# Patient Record
Sex: Male | Born: 1975 | Race: White | Hispanic: No | Marital: Single | Smoking: Current every day smoker
Health system: Southern US, Community
[De-identification: ages and names within clinical notes are randomized; demographics above are authoritative.]

## PROBLEM LIST (undated history)

## (undated) DIAGNOSIS — F329 Major depressive disorder, single episode, unspecified: Secondary | ICD-10-CM

## (undated) DIAGNOSIS — F259 Schizoaffective disorder, unspecified: Secondary | ICD-10-CM

## (undated) DIAGNOSIS — F32A Depression, unspecified: Secondary | ICD-10-CM

## (undated) DIAGNOSIS — F319 Bipolar disorder, unspecified: Secondary | ICD-10-CM

## (undated) DIAGNOSIS — F250 Schizoaffective disorder, bipolar type: Secondary | ICD-10-CM

## (undated) DIAGNOSIS — K219 Gastro-esophageal reflux disease without esophagitis: Secondary | ICD-10-CM

## (undated) DIAGNOSIS — F429 Obsessive-compulsive disorder, unspecified: Secondary | ICD-10-CM

## (undated) HISTORY — PX: MOUTH SURGERY: SHX715

---

## 2012-06-24 ENCOUNTER — Emergency Department (HOSPITAL_COMMUNITY): Payer: Medicare Other

## 2012-06-24 ENCOUNTER — Emergency Department (HOSPITAL_COMMUNITY)
Admission: EM | Admit: 2012-06-24 | Discharge: 2012-06-24 | Disposition: A | Discharge status: Home / Self Care | Payer: Medicare Other | Attending: Emergency Medicine | Admitting: Emergency Medicine

## 2012-06-24 ENCOUNTER — Encounter (HOSPITAL_COMMUNITY): Payer: Self-pay | Admitting: Nurse Practitioner

## 2012-06-24 DIAGNOSIS — F17200 Nicotine dependence, unspecified, uncomplicated: Secondary | ICD-10-CM | POA: Insufficient documentation

## 2012-06-24 DIAGNOSIS — S02609A Fracture of mandible, unspecified, initial encounter for closed fracture: Secondary | ICD-10-CM | POA: Insufficient documentation

## 2012-06-24 MED ORDER — HYDROCODONE-ACETAMINOPHEN 7.5-325 MG/15ML PO SOLN
10.0000 mL | Freq: Once | ORAL | Status: AC
  Administered 2012-06-24: 10 mL via ORAL
  Filled 2012-06-24: qty 15

## 2012-06-24 MED ORDER — HYDROCODONE-ACETAMINOPHEN 7.5-500 MG/15ML PO SOLN: 15.0000 mL | Freq: Four times a day (QID) | ORAL | Status: DC | PRN
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
## 2012-06-26 NOTE — ED Provider Notes (Signed)
Medical screening examination/treatment/procedure(s) were performed by non-physician practitioner and as supervising physician I was immediately available for consultation/collaboration.  Welden Hausmann, MD 06/26/12 0059 

## 2013-01-05 ENCOUNTER — Emergency Department (HOSPITAL_COMMUNITY)
Admission: EM | Admit: 2013-01-05 | Discharge: 2013-01-05 | Disposition: A | Discharge status: Home / Self Care | Payer: Medicare Other | Attending: Emergency Medicine | Admitting: Emergency Medicine

## 2013-01-05 ENCOUNTER — Encounter (HOSPITAL_COMMUNITY): Payer: Self-pay | Admitting: Emergency Medicine

## 2013-01-05 ENCOUNTER — Emergency Department (HOSPITAL_COMMUNITY): Payer: Medicare Other

## 2013-01-05 DIAGNOSIS — S62309A Unspecified fracture of unspecified metacarpal bone, initial encounter for closed fracture: Secondary | ICD-10-CM | POA: Insufficient documentation

## 2013-01-05 DIAGNOSIS — S59909A Unspecified injury of unspecified elbow, initial encounter: Secondary | ICD-10-CM | POA: Insufficient documentation

## 2013-01-05 DIAGNOSIS — Z8659 Personal history of other mental and behavioral disorders: Secondary | ICD-10-CM | POA: Insufficient documentation

## 2013-01-05 DIAGNOSIS — S6990XA Unspecified injury of unspecified wrist, hand and finger(s), initial encounter: Secondary | ICD-10-CM | POA: Insufficient documentation

## 2013-01-05 DIAGNOSIS — Z8719 Personal history of other diseases of the digestive system: Secondary | ICD-10-CM | POA: Insufficient documentation

## 2013-01-05 DIAGNOSIS — F17200 Nicotine dependence, unspecified, uncomplicated: Secondary | ICD-10-CM | POA: Insufficient documentation

## 2013-01-05 HISTORY — DX: Major depressive disorder, single episode, unspecified: F32.9

## 2013-01-05 HISTORY — DX: Gastro-esophageal reflux disease without esophagitis: K21.9

## 2013-01-05 HISTORY — DX: Depression, unspecified: F32.A

## 2013-01-05 MED ORDER — KETOROLAC TROMETHAMINE 60 MG/2ML IM SOLN
60.0000 mg | Freq: Once | INTRAMUSCULAR | Status: AC
  Administered 2013-01-05: 60 mg via INTRAMUSCULAR
  Filled 2013-01-05: qty 2

## 2013-01-05 MED ORDER — OXYCODONE-ACETAMINOPHEN 5-325 MG PO TABS: 2.0000 | ORAL_TABLET | ORAL | Status: DC | PRN

## 2013-01-05 MED ORDER — OXYCODONE-ACETAMINOPHEN 5-325 MG PO TABS
2.0000 | ORAL_TABLET | Freq: Once | ORAL | Status: AC
  Administered 2013-01-05: 2 via ORAL
  Filled 2013-01-05: qty 2
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
## 2013-01-05 NOTE — ED Notes (Signed)
Ortho called.  They are on the way to place splint.

## 2013-01-05 NOTE — Progress Notes (Signed)
Orthopedic Tech Progress Note Patient Details:  Richard [name] 1976/02/08 [id number] Ulna gutter splint applied to Right UE. Tolerated well. Arm sling provided.  Ortho Devices Type of Ortho Device: Arm sling;Ulna gutter splint Ortho Device/Splint Location: Right UE Ortho Device/Splint Interventions: Application   [name] 01/05/2013, 1:06 PM

## 2013-01-07 NOTE — ED Provider Notes (Signed)
Medical screening examination/treatment/procedure(s) were performed by non-physician practitioner and as supervising physician I was immediately available for consultation/collaboration.    Candyce Churn, MD 01/07/13 [phone]

## 2013-04-21 ENCOUNTER — Emergency Department (HOSPITAL_COMMUNITY)
Admission: EM | Admit: 2013-04-21 | Discharge: 2013-04-21 | Disposition: A | Discharge status: Home / Self Care | Payer: Medicare Other | Attending: Emergency Medicine | Admitting: Emergency Medicine

## 2013-04-21 ENCOUNTER — Emergency Department (HOSPITAL_COMMUNITY): Payer: Medicare Other

## 2013-04-21 ENCOUNTER — Encounter (HOSPITAL_COMMUNITY): Payer: Self-pay | Admitting: Emergency Medicine

## 2013-04-21 DIAGNOSIS — M25569 Pain in unspecified knee: Secondary | ICD-10-CM | POA: Insufficient documentation

## 2013-04-21 DIAGNOSIS — M25562 Pain in left knee: Secondary | ICD-10-CM

## 2013-04-21 DIAGNOSIS — F17200 Nicotine dependence, unspecified, uncomplicated: Secondary | ICD-10-CM | POA: Insufficient documentation

## 2013-04-21 DIAGNOSIS — Z8659 Personal history of other mental and behavioral disorders: Secondary | ICD-10-CM | POA: Insufficient documentation

## 2013-04-21 DIAGNOSIS — Z8719 Personal history of other diseases of the digestive system: Secondary | ICD-10-CM | POA: Insufficient documentation

## 2013-04-21 DIAGNOSIS — R52 Pain, unspecified: Secondary | ICD-10-CM | POA: Insufficient documentation

## 2013-04-21 MED ORDER — NAPROXEN 500 MG PO TABS: 500.0000 mg | ORAL_TABLET | Freq: Two times a day (BID) | ORAL | Status: DC

## 2013-04-21 NOTE — ED Provider Notes (Signed)
CSN: [id number]     Arrival date & time 04/21/13  1603 History  This chart was scribed for non-physician practitioner, Felicie Morn, NP-C working with Junius Argyle, MD by Greggory Stallion, ED scribe. This patient was seen in room TR08C/TR08C and the patient's care was started at 5:18 PM.   Chief Complaint  Patient presents with  . Knee Pain   The history is provided by the patient. No language interpreter was used.   HPI Comments: Richard [name] is a 38 y.o. male who presents to the Emergency Department complaining of sudden onset, constant left knee pain that started around 3 AM this morning. Pt thinks his left knee may have popped out of place but denies any injury. Bearing weight and straightening his leg worsen the pain.   Past Medical History  Diagnosis Date  . Acid reflux   . Depression    History reviewed. No pertinent past surgical history. No family history on file. History  Substance Use Topics  . Smoking status: Current Every Day Smoker  . Smokeless tobacco: Not on file  . Alcohol Use: Yes    Review of Systems  Musculoskeletal: Positive for arthralgias.  All other systems reviewed and are negative.   Allergies  Haloperidol and related  Home Medications   Current Outpatient Rx  Name  Route  Sig  Dispense  Refill  . oxyCODONE-acetaminophen (PERCOCET/ROXICET) 5-325 MG per tablet   Oral   Take 2 tablets by mouth every 4 (four) hours as needed for pain.   10 tablet   0    BP 127/84  Pulse 94  Temp(Src) 97.8 F (36.6 C) (Oral)  Resp 20  Ht 5\' 11"  (1.803 m)  Wt 155 lb (70.308 kg)  BMI 21.63 kg/m2  SpO2 100%  Physical Exam  Nursing note and vitals reviewed. Constitutional: He is oriented to person, place, and time. He appears well-developed and well-nourished. No distress.  HENT:  Head: Normocephalic and atraumatic.  Eyes: EOM are normal.  Neck: Neck supple. No tracheal deviation present.  Cardiovascular: Normal rate, regular rhythm and normal heart  sounds.   Pulmonary/Chest: Effort normal and breath sounds normal. No respiratory distress. He has no wheezes. He has no rales.  Musculoskeletal: Normal range of motion.  Tenderness across patellar tendon on the left. Limited ROM due to pain. No swelling appreciated. No laxity of the joint. Small effusion noted on xray.   Neurological: He is alert and oriented to person, place, and time.  Skin: Skin is warm and dry.  Psychiatric: He has a normal mood and affect. His behavior is normal.    ED Course  Procedures (including critical care time)  DIAGNOSTIC STUDIES: Oxygen Saturation is 100% on RA, normal by my interpretation.    COORDINATION OF CARE: 5:21 PM-Discussed treatment plan which includes an anti-inflammatory, ice, knee sleeve and crutches with pt at bedside and pt agreed to plan. Advised pt to follow up with orthopedics if symptoms do not resolve.  Labs Review Labs Reviewed - No data to display Imaging Review Dg Knee Complete 4 Views Left  04/21/2013   CLINICAL DATA:  Pain  EXAM: LEFT KNEE - COMPLETE 4+ VIEW  COMPARISON:  None.  FINDINGS: Frontal, lateral, and bilateral oblique views were obtained. There is no fracture or dislocation. There is a small joint effusion. Joint spaces appear intact. No erosive change.  IMPRESSION: Small joint effusion of uncertain etiology. No fracture or dislocation. No appreciable arthropathy.   Electronically Signed   By: Chrissie Noa  Margarita GrizzleWoodruff M.D.   On: 04/21/2013 17:11    EKG Interpretation   None      Radiology results reviewed and shared with patient.  No indication of bony injury, small joint effusion noted.  Patient reports pain over patellar tendon, states he is unable to straighten leg without pain.  No indication of infectious process.   RICEM, crutches, ortho follow-up. MDM   Knee pain.  I personally performed the services described in this documentation, which was scribed in my presence. The recorded information has been reviewed and is  accurate.   Jimmye Normanavid John Keagan Brislin, NP 04/22/13 [phone]

## 2013-04-21 NOTE — ED Notes (Signed)
Pt thinks left knee may have popped out but no injury.  Woke up with pain in middle of night and unable to bear weight on knee

## 2013-04-21 NOTE — Discharge Instructions (Signed)
Knee Pain Knee pain can be a result of an injury or other medical conditions. Treatment will depend on the cause of your pain. HOME CARE  Only take medicine as told by your doctor.  Keep a healthy weight. Being overweight can make the knee hurt more.  Stretch before exercising or playing sports.  If there is constant knee pain, change the way you exercise. Ask your doctor for advice.  Make sure shoes fit well. Choose the right shoe for the sport or activity.  Protect your knees. Wear kneepads if needed.  Rest when you are tired. GET HELP RIGHT AWAY IF:   Your knee pain does not stop.  Your knee pain does not get better.  Your knee joint feels hot to the touch.  You have a fever. MAKE SURE YOU:   Understand these instructions.  Will watch this condition.  Will get help right away if you are not doing well or get worse. Document Released: 06/29/2008 Document Revised: 06/25/2011 Document Reviewed: 06/29/2008 [employer] Patient Information 2014 [employer].  

## 2013-04-21 NOTE — ED Notes (Signed)
PT comfortable with d/c and f/u instructions. Prescriptions x1 

## 2013-04-22 NOTE — ED Provider Notes (Signed)
Medical screening examination/treatment/procedure(s) were performed by non-physician practitioner and as supervising physician I was immediately available for consultation/collaboration.  EKG Interpretation   None         Mung Rinker S Christropher Gintz, MD 04/22/13 1121 

## 2013-09-26 ENCOUNTER — Encounter (HOSPITAL_COMMUNITY): Payer: Self-pay | Admitting: Emergency Medicine

## 2013-09-26 ENCOUNTER — Emergency Department (HOSPITAL_COMMUNITY)
Admission: EM | Admit: 2013-09-26 | Discharge: 2013-09-26 | Disposition: A | Discharge status: Home / Self Care | Payer: Medicare Other | Attending: Emergency Medicine | Admitting: Emergency Medicine

## 2013-09-26 ENCOUNTER — Emergency Department (HOSPITAL_COMMUNITY): Payer: Medicare Other

## 2013-09-26 DIAGNOSIS — S022XXA Fracture of nasal bones, initial encounter for closed fracture: Secondary | ICD-10-CM | POA: Insufficient documentation

## 2013-09-26 DIAGNOSIS — T7411XA Adult physical abuse, confirmed, initial encounter: Secondary | ICD-10-CM | POA: Insufficient documentation

## 2013-09-26 DIAGNOSIS — S0191XA Laceration without foreign body of unspecified part of head, initial encounter: Secondary | ICD-10-CM

## 2013-09-26 DIAGNOSIS — IMO0002 Reserved for concepts with insufficient information to code with codable children: Secondary | ICD-10-CM

## 2013-09-26 DIAGNOSIS — Z8659 Personal history of other mental and behavioral disorders: Secondary | ICD-10-CM | POA: Insufficient documentation

## 2013-09-26 DIAGNOSIS — F17200 Nicotine dependence, unspecified, uncomplicated: Secondary | ICD-10-CM | POA: Insufficient documentation

## 2013-09-26 DIAGNOSIS — Z8719 Personal history of other diseases of the digestive system: Secondary | ICD-10-CM | POA: Insufficient documentation

## 2013-09-26 DIAGNOSIS — Z23 Encounter for immunization: Secondary | ICD-10-CM | POA: Insufficient documentation

## 2013-09-26 DIAGNOSIS — S0190XA Unspecified open wound of unspecified part of head, initial encounter: Secondary | ICD-10-CM | POA: Insufficient documentation

## 2013-09-26 HISTORY — DX: Obsessive-compulsive disorder, unspecified: F42.9

## 2013-09-26 HISTORY — DX: Schizoaffective disorder, unspecified: F25.9

## 2013-09-26 HISTORY — DX: Bipolar disorder, unspecified: F31.9

## 2013-09-26 MED ORDER — HYDROCODONE-ACETAMINOPHEN 5-325 MG PO TABS: 1.0000 | ORAL_TABLET | ORAL | Status: DC | PRN

## 2013-09-26 MED ORDER — IBUPROFEN 800 MG PO TABS
800.0000 mg | ORAL_TABLET | Freq: Once | ORAL | Status: AC
  Administered 2013-09-26: 800 mg via ORAL
  Filled 2013-09-26: qty 1

## 2013-09-26 MED ORDER — TETANUS-DIPHTH-ACELL PERTUSSIS 5-2.5-18.5 LF-MCG/0.5 IM SUSP
0.5000 mL | Freq: Once | INTRAMUSCULAR | Status: AC
  Administered 2013-09-26: 0.5 mL via INTRAMUSCULAR
  Filled 2013-09-26: qty 0.5
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
## 2013-09-26 NOTE — ED Provider Notes (Signed)
Medical screening examination/treatment/procedure(s) were performed by non-physician practitioner and as supervising physician I was immediately available for consultation/collaboration.   EKG Interpretation None       Adelaine Roppolo M Shaw Dobek, MD 09/26/13 2113 

## 2013-09-26 NOTE — Discharge Instructions (Signed)
Take the prescribed medication as directed for pain. Follow-up with urgent care in 1 week for suture removal. Follow-up with ENT to make sure nasal fractures are healing appropriately. Return to the ED for new or worsening symptoms.

## 2013-09-26 NOTE — ED Notes (Signed)
CT informed this RN, CT needs to rescan pt, scans are not clear.

## 2013-10-04 ENCOUNTER — Emergency Department (HOSPITAL_COMMUNITY)
Admission: EM | Admit: 2013-10-04 | Discharge: 2013-10-04 | Disposition: A | Discharge status: Home / Self Care | Payer: Medicare Other | Attending: Emergency Medicine | Admitting: Emergency Medicine

## 2013-10-04 DIAGNOSIS — Z8719 Personal history of other diseases of the digestive system: Secondary | ICD-10-CM | POA: Insufficient documentation

## 2013-10-04 DIAGNOSIS — Z8669 Personal history of other diseases of the nervous system and sense organs: Secondary | ICD-10-CM | POA: Insufficient documentation

## 2013-10-04 DIAGNOSIS — Z4802 Encounter for removal of sutures: Secondary | ICD-10-CM

## 2013-10-04 DIAGNOSIS — Z8659 Personal history of other mental and behavioral disorders: Secondary | ICD-10-CM | POA: Insufficient documentation

## 2013-10-04 DIAGNOSIS — F17200 Nicotine dependence, unspecified, uncomplicated: Secondary | ICD-10-CM | POA: Insufficient documentation

## 2013-10-04 NOTE — ED Provider Notes (Signed)
CSN: [id number]     Arrival date & time 10/04/13  1229 History  This chart was scribed for Marlon Peliffany Greene, PA-C, working with Merrie RoofJohn David Wofford III, MD, by Ardelia Memsylan Malpass ED Scribe. This patient was seen in room TR09C/TR09C and the patient's care was started at 12:52 PM.   Chief Complaint  Patient presents with  . Suture / Staple Removal    The history is provided by the patient. No language interpreter was used.    HPI Comments: Richard [name] is a 38 y.o. male who presents to the Emergency Department requesting staple removal from his scalp. Pt has a well-healed laceration to the area. He is not having any pain to the area and he does not believe that the area is infected. Pt has no complaints or symptoms at this time.   Past Medical History  Diagnosis Date  . Acid reflux   . Depression   . Schizoaffective disorder   . Bipolar 1 disorder   . OCD (obsessive compulsive disorder)    No past surgical history on file. No family history on file. History  Substance Use Topics  . Smoking status: Current Every Day Smoker  . Smokeless tobacco: Not on file  . Alcohol Use: Yes    Review of Systems  Skin: Positive for wound (well-healed laceration to scalp).  All other systems reviewed and are negative.   Allergies  Haloperidol and related  Home Medications   Prior to Admission medications   Medication Sig Start Date End Date Taking? Authorizing Provider  HYDROcodone-acetaminophen (NORCO/VICODIN) 5-325 MG per tablet Take 1 tablet by mouth every 4 (four) hours as needed. 09/26/13   Garlon HatchetLisa M Sanders, PA-C   Triage Vitals: BP 114/83  Pulse 77  Temp(Src) 97 F (36.1 C) (Oral)  Resp 16  Ht 5\' 11"  (1.803 m)  Wt 150 lb (68.04 kg)  BMI 20.93 kg/m2  SpO2 100%  Physical Exam  Nursing note and vitals reviewed. Constitutional: He is oriented to person, place, and time. He appears well-developed and well-nourished. No distress.  HENT:  Head: Normocephalic.    Eyes: Conjunctivae and EOM  are normal.  Neck: Neck supple. No tracheal deviation present.  Cardiovascular: Normal rate.   Pulmonary/Chest: Effort normal. No respiratory distress.  Musculoskeletal: Normal range of motion.  Neurological: He is alert and oriented to person, place, and time.  Skin: Skin is warm and dry.  Psychiatric: He has a normal mood and affect. His behavior is normal.    ED Course  Procedures (including critical care time)  SUTURE REMOVAL Performed by: Marlon Peliffany Greene, PA-C Authorized by: Merrie RoofJohn David Wofford III, MD Consent: Verbal consent obtained. Consent given by: patient Required items: required blood products, implants, devices, and special equipment available  Time out: Immediately prior to procedure a "time out" was called to verify the correct patient, procedure, equipment, support staff and site/side marked as required. Location: scalp Wound Appearance:clean Staples Removed: yes Post-removal: bacitracin applied Patient tolerance: Patient tolerated the procedure well with no immediate complications.   DIAGNOSTIC STUDIES: Oxygen Saturation is 100% on RA, normal by my interpretation.    COORDINATION OF CARE: 12:57 PM- Staples were removed. Pt advised of plan for treatment and pt agrees.  Labs Review Labs Reviewed - No data to display  Imaging Review No results found.   EKG Interpretation None      MDM   Final diagnoses:  Encounter for staple removal    37 y.o.Richard [name]'s evaluation in the Emergency Department is complete. It  has been determined that no acute conditions requiring further emergency intervention are present at this time. The patient/guardian have been advised of the diagnosis and plan. We have discussed signs and symptoms that warrant return to the ED, such as changes or worsening in symptoms.  Vital signs are stable at discharge. Filed Vitals:   10/04/13 1247  BP: 114/83  Pulse: 77  Temp: 97 F (36.1 C)  Resp: 16    Patient/guardian has voiced  understanding and agreed to follow-up with the PCP or specialist.  I personally performed the services described in this documentation, which was scribed in my presence. The recorded information has been reviewed and is accurate.   Dorthula Matasiffany G Greene, PA-C 10/04/13 1319

## 2013-10-04 NOTE — ED Notes (Signed)
Staples top of head. Will approximated, no redness or drainage noted.

## 2013-10-04 NOTE — ED Notes (Signed)
Staples removed 

## 2013-10-04 NOTE — Discharge Instructions (Signed)
Staple Removal, Care After °The staples that were used to close your skin have been removed. The care described here will need to continue until the wound is completely healed and your health care provider confirms that wound care can be stopped. °HOME CARE INSTRUCTIONS  °· Keep the wound site dry and clean. Do not soak it in water. °· If skin adhesive strips were applied after the staples were removed, they will begin to peel off in a few days. Allow them to remain in place until they fall off on their own. °· If you still have a bandage (dressing), change it at least once a day or as directed by your health care provider. If the dressing sticks, pour warm, sterile water over it until it loosens and can be removed without pulling apart the wound edges. Pat dry with a clean towel. °· Apply cream or ointment that stops the growth of bacteria (antibacterial cream or ointment) only if your health care provider has directed you to do so. Place a nonstick bandage over the wound to prevent the dressing from sticking. °· Cover the nonstick bandage with a new dressing as directed by your health care provider. °· If the bandage becomes wet, dirty, or develops a bad smell, change it as soon as possible. °· New scars become sunburned easily. Use sunscreens with a sun protection factor (SPF) of at least 15 when out in the sun. Reapply the SPF every 2 hours. °· Only take medicines as directed by your health care provider. °SEEK IMMEDIATE MEDICAL CARE IF:  °· You have redness, swelling, or increasing pain in the wound. °· You have pus coming from the wound. °· You have a fever. °· You notice a bad smell coming from the wound or dressing. °· Your wound edges open up after staples have been removed. °MAKE SURE YOU:  °· Understand these instructions. °· Will watch your condition. °· Will get help right away if you are not doing well or get worse. °Document Released: 03/15/2008 Document Revised: 04/07/2013 Document Reviewed:  03/15/2008 °[employer]® Patient Information ©2015 [employer]. This information is not intended to replace advice given to you by your health care provider. Make sure you discuss any questions you have with your health care provider. ° °

## 2013-10-05 NOTE — ED Provider Notes (Signed)
Medical screening examination/treatment/procedure(s) were performed by non-physician practitioner and as supervising physician I was immediately available for consultation/collaboration.   EKG Interpretation None        Candyce ChurnJohn David Wofford III, MD 10/05/13 [phone]

## 2015-05-23 ENCOUNTER — Ambulatory Visit: Payer: Self-pay | Admitting: Medical

## 2015-12-18 ENCOUNTER — Encounter (HOSPITAL_COMMUNITY): Payer: Self-pay | Admitting: Emergency Medicine

## 2015-12-18 ENCOUNTER — Emergency Department (HOSPITAL_COMMUNITY)
Admission: EM | Admit: 2015-12-18 | Discharge: 2015-12-19 | Disposition: A | Discharge status: Home / Self Care | Payer: Medicare HMO | Attending: Emergency Medicine | Admitting: Emergency Medicine

## 2015-12-18 DIAGNOSIS — F419 Anxiety disorder, unspecified: Secondary | ICD-10-CM | POA: Diagnosis not present

## 2015-12-18 DIAGNOSIS — Z5181 Encounter for therapeutic drug level monitoring: Secondary | ICD-10-CM | POA: Diagnosis not present

## 2015-12-18 DIAGNOSIS — F17200 Nicotine dependence, unspecified, uncomplicated: Secondary | ICD-10-CM | POA: Insufficient documentation

## 2015-12-18 DIAGNOSIS — Z760 Encounter for issue of repeat prescription: Secondary | ICD-10-CM | POA: Diagnosis present

## 2015-12-18 HISTORY — DX: Schizoaffective disorder, unspecified: F25.9

## 2015-12-18 HISTORY — DX: Schizoaffective disorder, bipolar type: F25.0

## 2015-12-18 LAB — COMPREHENSIVE METABOLIC PANEL
ALBUMIN: 4.7 g/dL (ref 3.5–5.0)
ALT: 22 U/L (ref 17–63)
ANION GAP: 6 (ref 5–15)
AST: 23 U/L (ref 15–41)
Alkaline Phosphatase: 59 U/L (ref 38–126)
BUN: 16 mg/dL (ref 6–20)
CO2: 28 mmol/L (ref 22–32)
Calcium: 9.3 mg/dL (ref 8.9–10.3)
Chloride: 103 mmol/L (ref 101–111)
Creatinine, Ser: 0.93 mg/dL (ref 0.61–1.24)
GFR calc non Af Amer: >60 mL/min (ref >60)
Glucose, Bld: 90 mg/dL (ref 65–99)
POTASSIUM: 4.3 mmol/L (ref 3.5–5.1)
SODIUM: 137 mmol/L (ref 135–145)
Total Bilirubin: 0.5 mg/dL (ref 0.3–1.2)
Total Protein: 7.7 g/dL (ref 6.5–8.1)

## 2015-12-18 LAB — CBC WITH DIFFERENTIAL/PLATELET
BASOS PCT: 0 %
Basophils Absolute: 0 10*3/uL (ref 0.0–0.1)
EOS ABS: 0.2 10*3/uL (ref 0.0–0.7)
Eosinophils Relative: 3 %
HEMATOCRIT: 44.5 % (ref 39.0–52.0)
HEMOGLOBIN: 15.2 g/dL (ref 13.0–17.0)
LYMPHS ABS: 2.2 10*3/uL (ref 0.7–4.0)
Lymphocytes Relative: 30 %
MCH: 30.8 pg (ref 26.0–34.0)
MCHC: 34.2 g/dL (ref 30.0–36.0)
MCV: 90.1 fL (ref 78.0–100.0)
MONOS PCT: 15 %
Monocytes Absolute: 1.1 10*3/uL — ABNORMAL HIGH (ref 0.1–1.0)
NEUTROS ABS: 3.7 10*3/uL (ref 1.7–7.7)
NEUTROS PCT: 52 %
Platelets: 357 10*3/uL (ref 150–400)
RBC: 4.94 MIL/uL (ref 4.22–5.81)
RDW: 13.9 % (ref 11.5–15.5)
WBC: 7.2 10*3/uL (ref 4.0–10.5)

## 2015-12-18 LAB — RAPID URINE DRUG SCREEN, HOSP PERFORMED
AMPHETAMINES: NOT DETECTED
BARBITURATES: NOT DETECTED
Benzodiazepines: NOT DETECTED
COCAINE: POSITIVE — AB
OPIATES: NOT DETECTED
TETRAHYDROCANNABINOL: POSITIVE — AB

## 2015-12-18 LAB — ACETAMINOPHEN LEVEL: Acetaminophen (Tylenol), Serum: <10 ug/mL — ABNORMAL LOW (ref 10–30)

## 2015-12-18 LAB — ETHANOL: Alcohol, Ethyl (B): <5 mg/dL (ref <5)

## 2015-12-18 MED ORDER — LORAZEPAM 1 MG PO TABS
1.0000 mg | ORAL_TABLET | Freq: Once | ORAL | Status: AC
  Administered 2015-12-18: 1 mg via ORAL
  Filled 2015-12-18: qty 1

## 2015-12-18 NOTE — BH Assessment (Addendum)
Discussed recommendation with EDP who agrees with disposition.   Discussed the recommendation with the patient who states that he needs a "social security evaluation" and was encouraged to go to see his psychiatrist. Patient states that he would still like his Thorazine and patient was informed that unfortunately the ED is unable to prescribe medications and he would need to return to his regular psychiatrist at [facility] for that refill. Patient was provided a list of additional psychiatrist in the area due to patient stating that he may want to change his psychiatrist.  Patient asked where he would need to go for a "social security evaluation" and was encouraged to return to his psychiatrist. Patient requested to go to behavioral health and was informed that currently he does not meet criteria and would need to follow up with his psychiatrist for his refill. Patient states "what if I come back tomorrow?" and was informed that he would be evaluated if he returns and a recommendation would be made at that time.   Davina PokeJoVea Arav Bannister, LCSW Therapeutic Triage Specialist [facility] 12/18/2015 11:50 PM

## 2015-12-18 NOTE — BH Assessment (Signed)
Assessment completed. Consulted with Alberteen SamFran Hobson, NP who recommends patient follow up with his outpatient provider.   Davina PokeJoVea Mady Oubre, LCSW Therapeutic Triage Specialist [facility] 12/18/2015 11:37 PM

## 2015-12-18 NOTE — ED Notes (Signed)
Pt's contact:  Richard CaffeyLinda Garza (mother) ----- tel# [phone]

## 2015-12-18 NOTE — ED Notes (Signed)
Pt was given sprite.

## 2015-12-18 NOTE — ED Notes (Signed)
TTS consult in process in conference room at this time. 
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
## 2015-12-18 NOTE — BH Assessment (Addendum)
Assessment Note  Richard ElliotJonathan [name] is an 40 y.o. male presenting voluntarily to [facility] requesting a prescription for 100mg  of Thorazine. Patient states that he is anxious, hears whispers, and "it's getting tougher and tougher to leave my house" due to his increasing anxiety. Patient states that he was previously prescribed Thorazine, however, his new psychiatrist will not prescribe Thorazine due to him drinking alcohol. Patient states that he was not prescribed anything in the meantime and was scheduled for a six week follow up appointment. Patient states this occurred "a few weeks ago." Patient states that he feels that he cannot wait to have his thorazine prescribed again and "nothing else works." Patient denies SI and states that he has had "about ten" previous attempts with the last attempt being "about a year ago" when he attempted to overdose on cocaine. Patient denies SI since that time. Patient states that he cuts himself on his arm "about once a month" to "distract" himself from the anxiety and the whispers. Patient states that he has not cut himself "in a while." Patient denies HI and history of aggression. Patient denies access to firearms. Patient denies pending charges and upcoming court dates. Patient denies active probation. Patient states that he has auditory hallucinations of "whispers" stating that he is unable to understand what the voices are saying because they sound "far away." Patient states that he experiences tactile hallucinations and feels "bugs crawling on my back" at times. Patient states that when he "is stressed' he sometimes "sees like a shadow or something." Patient denies that these hallucinations are command in nature. Patient states that he uses about $20 of THC daily and states that he uses "about a 40 or two, I guess" every other day. Patient denies use of other drugs. Patient UDS +THC and +cocaine. Patient BAL<5 at time of assessment.    Patient is alert and oriented x4. Patient  is anxious during the assessment and mood and affect are congruent. Patient states that [place] "a [place] years ago" and has experienced difficulty getting an outpatient provider due to providers not accepting his insurance. Patient states that he got an appointment with [facility] and has a Therapist, sportssychiatrist and a Counselor there, however, he cannot get a prescription for the medication that he feels works the best - which is Thorazine. Patient states that his mother is supportive and he lives in an apartment alone.   Consulted with Alberteen SamFran Hobson, NP who recommends that patient follow up with his outpatient provider.   Diagnosis: Cocaine use disorder, Moderate  Past Medical History:  Past Medical History:  Diagnosis Date  . Schizo affective schizophrenia [facility](HCC)     Past Surgical History:  Procedure Laterality Date  . MOUTH SURGERY      Family History: No family history on file.  Social History:  reports that he has been smoking.  He has never used smokeless tobacco. He reports that he drinks alcohol. He reports that he uses drugs, including Marijuana.  Additional Social History:  Alcohol / Drug Use Pain Medications: Denies Prescriptions: Denies Over the Counter: Denies History of alcohol / drug use?: Yes Substance #1 Name of Substance 1: THC 1 - Age of First Use: 13 1 - Amount (size/oz): $20 1 - Frequency: daily 1 - Duration: ongoing 1 - Last Use / Amount: Friday Substance #2 Name of Substance 2: Alcohol 2 - Age of First Use: 13 2 - Amount (size/oz): 2 - 40 ounce beers 2 - Frequency: every  other day 2 - Duration: ongoing 2 - Last Use / Amount: Friday  CIWA: CIWA-Ar BP: 130/86 Pulse Rate: 74 COWS:    Allergies:  Allergies  Allergen Reactions  . Haldol [Haloperidol] Other (See Comments)    Paralysis     Home Medications:  (Not in a hospital admission)  OB/GYN Status:  No LMP for male patient.  General Assessment Data Location  of Assessment: [facility] TTS Assessment: In system Is this a Tele or Face-to-Face Assessment?: Face-to-Face Is this an Initial Assessment or a Re-assessment for this encounter?: Initial Assessment Marital status: Divorced Is patient pregnant?: No Pregnancy Status: No Living Arrangements: Alone Can pt return to current living arrangement?: Yes Admission Status: Voluntary Is patient capable of signing voluntary admission?: Yes Referral Source: Self/Family/Friend     Crisis Care Plan Living Arrangements: Alone Name of Psychiatrist: [facility] Name of Therapist: [facility]  Education Status Is patient currently in school?: No Highest grade of school patient has completed: 6th  Risk to self with the past 6 months Suicidal Ideation: No Has patient been a risk to self within the past 6 months prior to admission? : No Suicidal Intent: No Has patient had any suicidal intent within the past 6 months prior to admission? : No Is patient at risk for suicide?: No Suicidal Plan?: No Has patient had any suicidal plan within the past 6 months prior to admission? : No Access to Means: No What has been your use of drugs/alcohol within the last 12 months?: THC and Alcohol  Previous Attempts/Gestures: Yes How many times?:  ("about ten") Other Self Harm Risks: Cutting to "distract" himself Triggers for Past Attempts: Other (Comment) ("my emotions") Intentional Self Injurious Behavior: Cutting ("about once a month maybe") Comment - Self Injurious Behavior: to disctract himself from anxiety Family Suicide History: No Recent stressful life event(s): Other (Comment) (hearing whispers, tactile hallucinations, anxiety) Persecutory voices/beliefs?: No Depression: Yes Depression Symptoms: Insomnia, Isolating, Fatigue, Feeling worthless/self pity Substance abuse history and/or treatment for substance abuse?: Yes Suicide prevention information given to non-admitted patients: Not  applicable  Risk to Others within the past 6 months Homicidal Ideation: No Does patient have any lifetime risk of violence toward others beyond the six months prior to admission? : No Thoughts of Harm to Others: No Current Homicidal Intent: No Current Homicidal Plan: No Access to Homicidal Means: No Identified Victim: Denies History of harm to others?: No Assessment of Violence: None Noted Violent Behavior Description: Denies Does patient have access to weapons?: No Criminal Charges Pending?: No Does patient have a court date: No Is patient on probation?: No  Psychosis Hallucinations: Auditory, Tactile, Visual (whispers, shadows, and bugs crawling on back) Delusions: None noted  Mental Status Report Appearance/Hygiene: Unremarkable Eye Contact: Fair Motor Activity: Restlessness Speech: Logical/coherent Level of Consciousness: Alert, Restless Mood: Anxious Affect: Anxious Anxiety Level: Moderate Thought Processes: Coherent, Relevant Judgement: Partial Orientation: Person, Place, Time, Situation, Appropriate for developmental age Obsessive Compulsive Thoughts/Behaviors: None  Cognitive Functioning Concentration: Decreased Memory: Recent Intact, Remote Intact IQ: Average Insight: Fair Impulse Control: Fair Appetite: Fair Sleep: Decreased Vegetative Symptoms: None  ADLScreening [facility] Assessment Services) Patient's cognitive ability adequate to safely complete daily activities?: Yes Patient able to express need for assistance with ADLs?: Yes Independently performs ADLs?: Yes (appropriate for developmental age)  Prior Inpatient Therapy Prior Inpatient Therapy: Yes Prior Therapy Dates: Multiple Prior Therapy Facilty/Provider(s): in [place] Reason for Treatment: Anxiety  Prior Outpatient Therapy Prior Outpatient Therapy: Yes Prior Therapy Dates: Present Prior Therapy  Facilty/Provider(s): [facility] Reason for Treatment: Anxiety Does patient have an ACCT  team?: No Does patient have Intensive In-House Services?  : No Does patient have [facility] services? : No Does patient have P4CC services?: No  ADL Screening (condition at time of admission) Patient's cognitive ability adequate to safely complete daily activities?: Yes Is the patient deaf or have difficulty hearing?: No Does the patient have difficulty seeing, even when wearing glasses/contacts?: No Does the patient have difficulty concentrating, remembering, or making decisions?: No Patient able to express need for assistance with ADLs?: Yes Does the patient have difficulty dressing or bathing?: No Independently performs ADLs?: Yes (appropriate for developmental age) Does the patient have difficulty walking or climbing stairs?: No Weakness of Legs: None Weakness of Arms/Hands: None  Home Assistive Devices/Equipment Home Assistive Devices/Equipment: None  Therapy Consults (therapy consults require a physician order) PT Evaluation Needed: No OT Evalulation Needed: No SLP Evaluation Needed: No Abuse/Neglect Assessment (Assessment to be complete while patient is alone) Physical Abuse: Denies Verbal Abuse: Denies Sexual Abuse: Denies Exploitation of patient/patient's resources: Denies Self-Neglect: Denies Values / Beliefs Cultural Requests During Hospitalization: None Spiritual Requests During Hospitalization: None Consults Spiritual Care Consult Needed: No Social Work Consult Needed: No Merchant navy officer (For Healthcare) Does patient have an advance directive?: No Would patient like information on creating an advanced directive?: No - patient declined information    Additional Information 1:1 In Past 12 Months?: No CIRT Risk: No Elopement Risk: No Does patient have medical clearance?: No     Disposition:  Disposition Initial Assessment Completed for this Encounter: Yes Disposition of Patient: Outpatient treatment, Referred to (per Alberteen Sam, NP) Type of outpatient  treatment: Adult Patient referred to: Other (Comment) (current provider [facility])  On Site Evaluation by:   Reviewed with Physician:    Glenn Christo 12/19/2015 1:00 AM

## 2015-12-18 NOTE — ED Provider Notes (Signed)
[facility] Provider Note   CSN: [id number] Arrival date & time: 12/18/15  1953   By signing my name below, I, Christel MormonMatthew Jamison, attest that this documentation has been prepared under the direction and in the presence of Audry Piliyler Jakeline Dave, PA-C. Electronically Signed: Christel MormonMatthew Jamison, Scribe. 12/18/2015. 8:54 PM.   History   Chief Complaint Chief Complaint  Patient presents with  . Medication Refill     The history is provided by the patient. No language interpreter was used.  HPI Comments:  Richard [name] is a 40 y.o. male with PMHx of schizoaffective disorder who presents to the Emergency Department requesting a medication refill of thorazine and asking to see a psychiatrist. Pt states that he is having constant anxiety, is hearing whispers, has worsening agoraphobia, is experiencing tactile hallucination, and is having trouble controlling his emotions. Pt states that these conditions have been present for ~22 years. Pt states that he has been out of thorazine for about a month and has been unable to see a psychiatrist for a refill. Pt denies SI or ideation to harm others.   Past Medical History:  Diagnosis Date  . Schizo affective schizophrenia (HCC)     There are no active problems to display for this patient.   Past Surgical History:  Procedure Laterality Date  . MOUTH SURGERY         Home Medications    Prior to Admission medications   Not on File    Family History No family history on file.  Social History Social History  Substance Use Topics  . Smoking status: Current Every Day Smoker  . Smokeless tobacco: Never Used  . Alcohol use Yes     Allergies   Haldol [haloperidol]   Review of Systems Review of Systems 10 Systems reviewed and are negative for acute change except as noted in the HPI.   Physical Exam Updated Vital Signs BP 147/99 (BP Location: Left Arm)   Pulse 86   Temp 97.9 F (36.6 C) (Oral)   Resp 14   Ht 5\' 10"  (1.778 m)   Wt 140  lb (63.5 kg)   SpO2 100%   BMI 20.09 kg/m   Physical Exam  Constitutional: He is oriented to person, place, and time. Vital signs are normal. He appears well-developed and well-nourished. No distress.  Pt visibly anxious and rocking back and forth  HENT:  Head: Normocephalic and atraumatic.  Right Ear: Hearing normal.  Left Ear: Hearing normal.  Eyes: Conjunctivae and EOM are normal. Pupils are equal, round, and reactive to light.  Neck: Normal range of motion. Neck supple.  Cardiovascular: Normal rate, regular rhythm and normal heart sounds.   Pulmonary/Chest: Effort normal and breath sounds normal.  Abdominal: Soft. He exhibits no distension.  Neurological: He is alert and oriented to person, place, and time.  Skin: Skin is warm and dry.  Psychiatric: His speech is normal and behavior is normal. Thought content normal. His mood appears anxious. He expresses no homicidal and no suicidal ideation. He expresses no suicidal plans and no homicidal plans.  Nursing note and vitals reviewed.  ED Treatments / Results  DIAGNOSTIC STUDIES:  Oxygen Saturation is 100% on RA, normal by my interpretation.    COORDINATION OF CARE:  8:54 PM Will order blood work. Will prescribe thorazine. Discussed treatment plan with pt at bedside and pt agreed to plan.  Labs (all labs ordered are listed, but only abnormal results are displayed) Labs Reviewed  CBC WITH DIFFERENTIAL/PLATELET - Abnormal; Notable  for the following:       Result Value   Monocytes Absolute 1.1 (*)    All other components within normal limits  URINE RAPID DRUG SCREEN, HOSP PERFORMED - Abnormal; Notable for the following:    Cocaine POSITIVE (*)    Tetrahydrocannabinol POSITIVE (*)    All other components within normal limits  ACETAMINOPHEN LEVEL - Abnormal; Notable for the following:    Acetaminophen (Tylenol), Serum <10 (*)    All other components within normal limits  COMPREHENSIVE METABOLIC PANEL  ETHANOL    EKG  EKG  Interpretation None      Radiology No results found.  Procedures Procedures (including critical care time)  Medications Ordered in ED Medications  LORazepam (ATIVAN) tablet 1 mg (not administered)     Initial Impression / Assessment and Plan / ED Course  I have reviewed the triage vital signs and the nursing notes.  Pertinent labs & imaging results that were available during my care of the patient were reviewed by me and considered in my medical decision making (see chart for details).  Clinical Course   Final Clinical Impressions(s) / ED Diagnoses  I have reviewed and evaluated the relevant laboratory values I have reviewed the relevant previous healthcare records. I obtained HPI from historian.  ED Course:  Assessment: Pt is a 39yM with hx schizoaffective disorder who presents with visual/auditory hallucination x 1 week. No SI/HI. States that he has been out of Thorazine x 1 week and wishes to have psychiatric evaluation. Pt here voluntarily. On exam, pt in NAD. Nontoxic/nonseptic appearing. VSS. Afebrile. Lungs CTA. Heart RRR. Abdomen nontender soft. Labs unremarkable. Given ativan in ED. TTS recommends outpatient psych evaluation. Patient is in no acute distress. Vital Signs are stable. Patient is able to ambulate. Patient able to tolerate PO.    Disposition/Plan:  DC Home  Additional Verbal discharge instructions given and discussed with patient.  Pt Instructed to f/u with Outpatient Psych in the next week for evaluation and treatment of symptoms. Return precautions given Pt acknowledges and agrees with plan  Supervising Physician Nira Conn, MD   Final diagnoses:  Anxiety    New Prescriptions New Prescriptions   No medications on file    I personally performed the services described in this documentation, which was scribed in my presence. The recorded information has been reviewed and is accurate.     Audry Pili, PA-C 12/18/15 [id number]    Nira Conn, MD 12/19/15 [phone]

## 2015-12-28 ENCOUNTER — Encounter (HOSPITAL_COMMUNITY): Payer: Self-pay

## 2015-12-28 ENCOUNTER — Emergency Department (HOSPITAL_COMMUNITY): Payer: Medicare HMO

## 2015-12-28 ENCOUNTER — Emergency Department (HOSPITAL_COMMUNITY)
Admission: EM | Admit: 2015-12-28 | Discharge: 2015-12-28 | Disposition: A | Discharge status: Home / Self Care | Payer: Medicare HMO | Attending: Emergency Medicine | Admitting: Emergency Medicine

## 2015-12-28 DIAGNOSIS — S63254A Unspecified dislocation of right ring finger, initial encounter: Secondary | ICD-10-CM | POA: Diagnosis not present

## 2015-12-28 DIAGNOSIS — Y939 Activity, unspecified: Secondary | ICD-10-CM | POA: Insufficient documentation

## 2015-12-28 DIAGNOSIS — W1789XA Other fall from one level to another, initial encounter: Secondary | ICD-10-CM | POA: Insufficient documentation

## 2015-12-28 DIAGNOSIS — S62101A Fracture of unspecified carpal bone, right wrist, initial encounter for closed fracture: Secondary | ICD-10-CM | POA: Diagnosis not present

## 2015-12-28 DIAGNOSIS — Y990 Civilian activity done for income or pay: Secondary | ICD-10-CM | POA: Diagnosis not present

## 2015-12-28 DIAGNOSIS — S6991XA Unspecified injury of right wrist, hand and finger(s), initial encounter: Secondary | ICD-10-CM | POA: Diagnosis present

## 2015-12-28 DIAGNOSIS — S63259A Unspecified dislocation of unspecified finger, initial encounter: Secondary | ICD-10-CM

## 2015-12-28 DIAGNOSIS — F17210 Nicotine dependence, cigarettes, uncomplicated: Secondary | ICD-10-CM | POA: Insufficient documentation

## 2015-12-28 DIAGNOSIS — S61219A Laceration without foreign body of unspecified finger without damage to nail, initial encounter: Secondary | ICD-10-CM

## 2015-12-28 DIAGNOSIS — Y929 Unspecified place or not applicable: Secondary | ICD-10-CM | POA: Diagnosis not present

## 2015-12-28 MED ORDER — BUPIVACAINE HCL 0.25 % IJ SOLN
10.0000 mL | Freq: Once | INTRAMUSCULAR | Status: DC
  Filled 2015-12-28: qty 10

## 2015-12-28 MED ORDER — LIDOCAINE HCL (PF) 1 % IJ SOLN
5.0000 mL | Freq: Once | INTRAMUSCULAR | Status: AC
  Administered 2015-12-28: 5 mL
  Filled 2015-12-28: qty 5

## 2015-12-28 MED ORDER — NAPROXEN 500 MG PO TABS: 500.0000 mg | ORAL_TABLET | Freq: Two times a day (BID) | ORAL | 0 refills | Status: DC

## 2015-12-28 MED ORDER — BUPIVACAINE HCL (PF) 0.25 % IJ SOLN
10.0000 mL | Freq: Once | INTRAMUSCULAR | Status: AC
  Administered 2015-12-28: 10 mL
  Filled 2015-12-28: qty 30

## 2015-12-28 MED ORDER — OXYCODONE-ACETAMINOPHEN 5-325 MG PO TABS
1.0000 | ORAL_TABLET | Freq: Once | ORAL | Status: AC
  Administered 2015-12-28: 1 via ORAL
  Filled 2015-12-28: qty 1

## 2015-12-28 NOTE — ED Notes (Signed)
PA at bedside for suture.

## 2015-12-28 NOTE — ED Notes (Signed)
Skin soaked and cleaned around lac.

## 2015-12-28 NOTE — ED Notes (Signed)
Pt departed in NAD, refused use of wheelchair.  

## 2015-12-28 NOTE — ED Notes (Signed)
Patient transported to X-ray 

## 2015-12-28 NOTE — Progress Notes (Signed)
Orthopedic Tech Progress Note Patient Details:  Conni ElliotJonathan Trevathan Sep 07, 1975 [id number]  Ortho Devices Type of Ortho Device: Ace wrap, Finger splint, Volar splint Ortho Device/Splint Location: RUE Ortho Device/Splint Interventions: Ordered, Application   Jennye MoccasinHughes, Godwin Tedesco Craig 12/28/2015, 7:33 PM

## 2015-12-28 NOTE — ED Provider Notes (Signed)
[facility]-EMERGENCY DEPT Provider Note   CSN: [id number] Arrival date & time: 12/28/15  1512  By signing my name below, I, Christy SartoriusAnastasia Kolousek, attest that this documentation has been prepared under the direction and in the presence of  Kerrie BuffaloHope Contrina Orona, NP. Electronically Signed: Christy SartoriusAnastasia Kolousek, ED Scribe. 12/28/15. 4:50 PM.  History   Chief Complaint Chief Complaint  Patient presents with  . Hand Injury   The history is provided by the patient and medical records. No language interpreter was used.  Hand Injury   The incident occurred less than 1 hour ago. The incident occurred at work. The injury mechanism was a fall. The pain is present in the right fingers, right wrist and right hand. The quality of the pain is described as sharp. The pain is moderate. The pain has been constant since the incident. Pertinent negatives include no fever. He reports no foreign bodies present. The symptoms are aggravated by movement, use and palpation. He has tried nothing for the symptoms.    HPI Comments:  Richard ElliotJonathan [name] is a 40 y.o. male who presents to the Emergency Department s/p fall just PTA complaining of pain in his right hand and right 4th finger.  He observes the 4th finger is more bent than normal.  He also notes pain in his right wrist.  He reports he fell off a loading ramp landing on his right hand.  He has tried nothing for his pain.  No additional injury or complaint noted.  His tetanus is out of date.    Past Medical History:  Diagnosis Date  . Schizo affective schizophrenia (HCC)     There are no active problems to display for this patient.   Past Surgical History:  Procedure Laterality Date  . MOUTH SURGERY        Home Medications    Prior to Admission medications   Medication Sig Start Date End Date Taking? Authorizing Provider  naproxen (NAPROSYN) 500 MG tablet Take 1 tablet (500 mg total) by mouth 2 (two) times daily. 12/28/15   Zara Wendt Orlene OchM Christelle Igoe, NP    Family History History  reviewed. No pertinent family history.  Social History Social History  Substance Use Topics  . Smoking status: Current Every Day Smoker    Packs/day: 1.00    Types: Cigarettes  . Smokeless tobacco: Never Used  . Alcohol use Yes     Comment: 2 beers per day      Allergies   Haldol [haloperidol]   Review of Systems Review of Systems  Constitutional: Negative for fever.  Musculoskeletal: Positive for arthralgias.       Wrist and finger pain right.  Skin: Positive for wound.  Neurological: Negative for weakness and numbness.     Physical Exam Updated Vital Signs BP 118/92 (BP Location: Left Arm)   Pulse 66   Temp 98.2 F (36.8 C) (Oral)   Resp 16   Ht 5\' 10"  (1.778 m)   Wt 65.8 kg   SpO2 100%   BMI 20.81 kg/m   Physical Exam  Constitutional: He is oriented to person, place, and time. He appears well-developed and well-nourished. No distress.  HENT:  Head: Normocephalic and atraumatic.  Eyes: Conjunctivae are normal.  Cardiovascular: Normal rate.   Pulmonary/Chest: Effort normal.  Musculoskeletal:  Deformity of the right ring finger at the DIP, laceration to the palmar aspect at the DIP.   Neurological: He is alert and oriented to person, place, and time.  Skin: Skin is warm and dry.  1  cm laceration to the palmar aspect of the right ring finger at the DIP.   Psychiatric: He has a normal mood and affect.  Nursing note and vitals reviewed.    ED Treatments / Results   DIAGNOSTIC STUDIES:  Oxygen Saturation is 100% on RA, NML by my interpretation.    COORDINATION OF CARE:  4:50 PM Discussed treatment plan with pt at bedside and pt agreed to plan.  Labs (all labs ordered are listed, but only abnormal results are displayed) Labs Reviewed - No data to display  Radiology Dg Wrist Complete Right  Result Date: 12/28/2015 CLINICAL DATA:  [place] Seat off trailer  today EXAM: RIGHT WRIST - COMPLETE 3+ VIEW COMPARISON:  None. FINDINGS: Fall views of the right wrist  submitted. There is acute minimal displaced fracture of the pisiform bone. IMPRESSION: Acute minimal displaced fracture of the pisiform bone. Electronically Signed   By: Natasha Mead M.D.   On: 12/28/2015 17:27   Dg Hand Complete Right  Result Date: 12/28/2015 CLINICAL DATA:  Fall from trailer.  Right anterior ring finger pain. EXAM: RIGHT HAND - COMPLETE 3+ VIEW COMPARISON:  None. FINDINGS: There is a posterior dislocation involving the fourth DIP joint. No fracture identified. No radio-opaque foreign bodies. IMPRESSION: 1. Posterior dislocation of the fourth DIP joint. Electronically Signed   By: Signa Kell M.D.   On: 12/28/2015 17:25    Procedures .Marland KitchenLaceration Repair Date/Time: 12/28/2015 6:31 PM Performed by: Janne Napoleon Authorized by: Raeford Razor   Consent:    Consent obtained:  Verbal   Consent given by:  Patient   Risks discussed:  Infection and pain   Alternatives discussed:  No treatment Anesthesia (see MAR for exact dosages):    Anesthesia method: digital block. Laceration details:    Location:  Hand   Hand location:  R palm   Length (cm):  1 Repair type:    Repair type:  Simple Pre-procedure details:    Preparation:  Patient was prepped and draped in usual sterile fashion and imaging obtained to evaluate for foreign bodies Exploration:    Hemostasis achieved with:  Direct pressure   Wound exploration: wound explored through full range of motion     Wound extent: no foreign bodies/material noted, no nerve damage noted and no tendon damage noted     Contaminated: no   Treatment:    Area cleansed with:  Betadine   Amount of cleaning:  Standard   Irrigation solution:  Sterile saline   Irrigation volume:  100 ccs   Irrigation method:  Pressure wash Skin repair:    Repair method:  Sutures   Suture size:  4-0   Suture material:  Prolene   Suture technique:  Simple interrupted   Number of sutures:  3 Approximation:    Approximation:  Close   Vermilion border:  well-aligned   Post-procedure details:    Dressing:  Antibiotic ointment, splint for protection and sterile dressing   Patient tolerance of procedure:  Tolerated well, no immediate complications Comments:     Dislocation of the ring finger reduced without difficulty.       (including critical care time)  Medications Ordered in ED Medications  lidocaine (PF) (XYLOCAINE) 1 % injection 5 mL (5 mLs Infiltration Given 12/28/15 1751)  bupivacaine (PF) (MARCAINE) 0.25 % injection 10 mL (10 mLs Infiltration Given 12/28/15 1751)  oxyCODONE-acetaminophen (PERCOCET/ROXICET) 5-325 MG per tablet 1 tablet (1 tablet Oral Given 12/28/15 1827)     Initial Impression / Assessment and Plan /  ED Course  I have reviewed the triage vital signs and the nursing notes.  Pertinent labs & imaging results that were available during my care of the patient were reviewed by me and considered in my medical decision making (see chart for details).  Clinical Course    X-Ray positive for dislocation of the right ring finger. Reduced without difficulty. X-ray positive for wrist fracture.   Pt advised to follow up with orthopedics. Patient given, splint while in ED, conservative therapy recommended and discussed. Patient will be discharged home & is agreeable with above plan. Returns precautions discussed. Pt appears safe for discharge.    Final Clinical Impressions(s) / ED Diagnoses   Final diagnoses:  Dislocation, finger closed, initial encounter  Laceration of finger, initial encounter  Wrist fracture, closed, right, initial encounter    New Prescriptions Discharge Medication List as of 12/28/2015  7:53 PM    START taking these medications   Details  naproxen (NAPROSYN) 500 MG tablet Take 1 tablet (500 mg total) by mouth 2 (two) times daily., Starting Wed 12/28/2015, Print       I personally performed the services described in this documentation, which was scribed in my presence. The recorded information  has been reviewed and is accurate.    [address] [place], [place] 12/29/15 4098    Raeford Razor, MD 12/29/15 [phone]

## 2015-12-28 NOTE — ED Triage Notes (Signed)
Pt fell off a loading ramp. Right fourth digit deformity noted. Pt also reports right wrist pain. Rings removed in triage.

## 2015-12-28 NOTE — ED Notes (Signed)
NP at bedside for procedure.

## 2016-01-03 ENCOUNTER — Encounter (HOSPITAL_COMMUNITY): Payer: Self-pay | Admitting: Emergency Medicine

## 2017-02-09 IMAGING — DX DG HAND COMPLETE 3+V*R*
3 series · 3 of 3 positions shown · non-contrast
Comparison: None.

CLINICAL DATA: Fall from trailer.  Right anterior ring finger pain.

EXAM:
RIGHT HAND - COMPLETE 3+ VIEW

[x hand pa right]
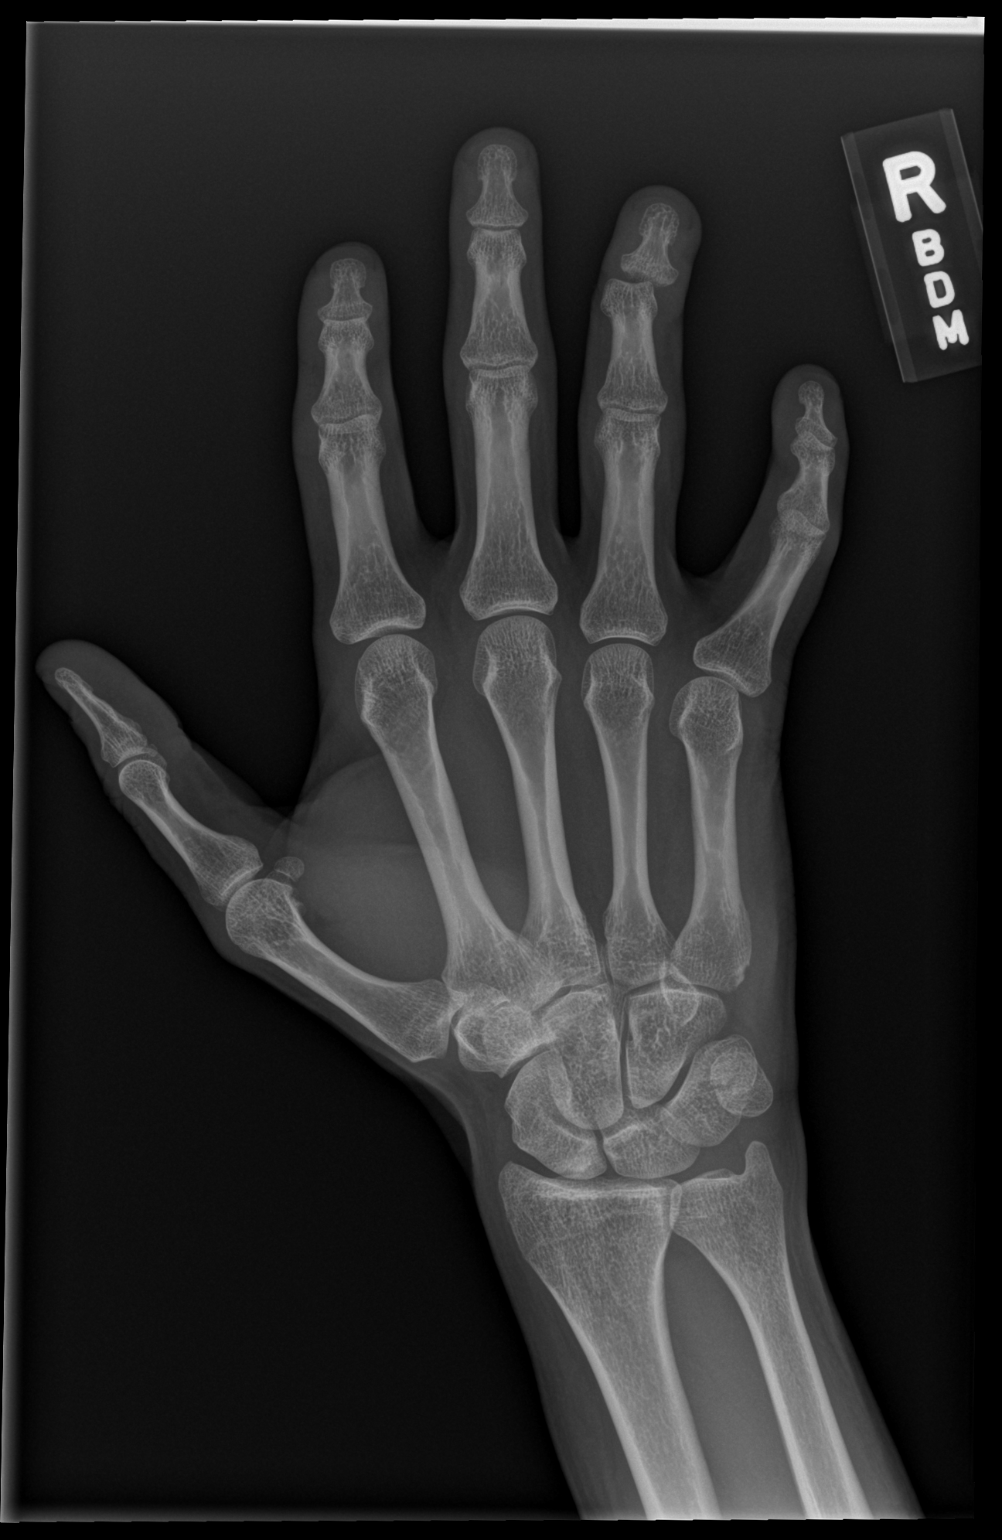

[x hand obl right]
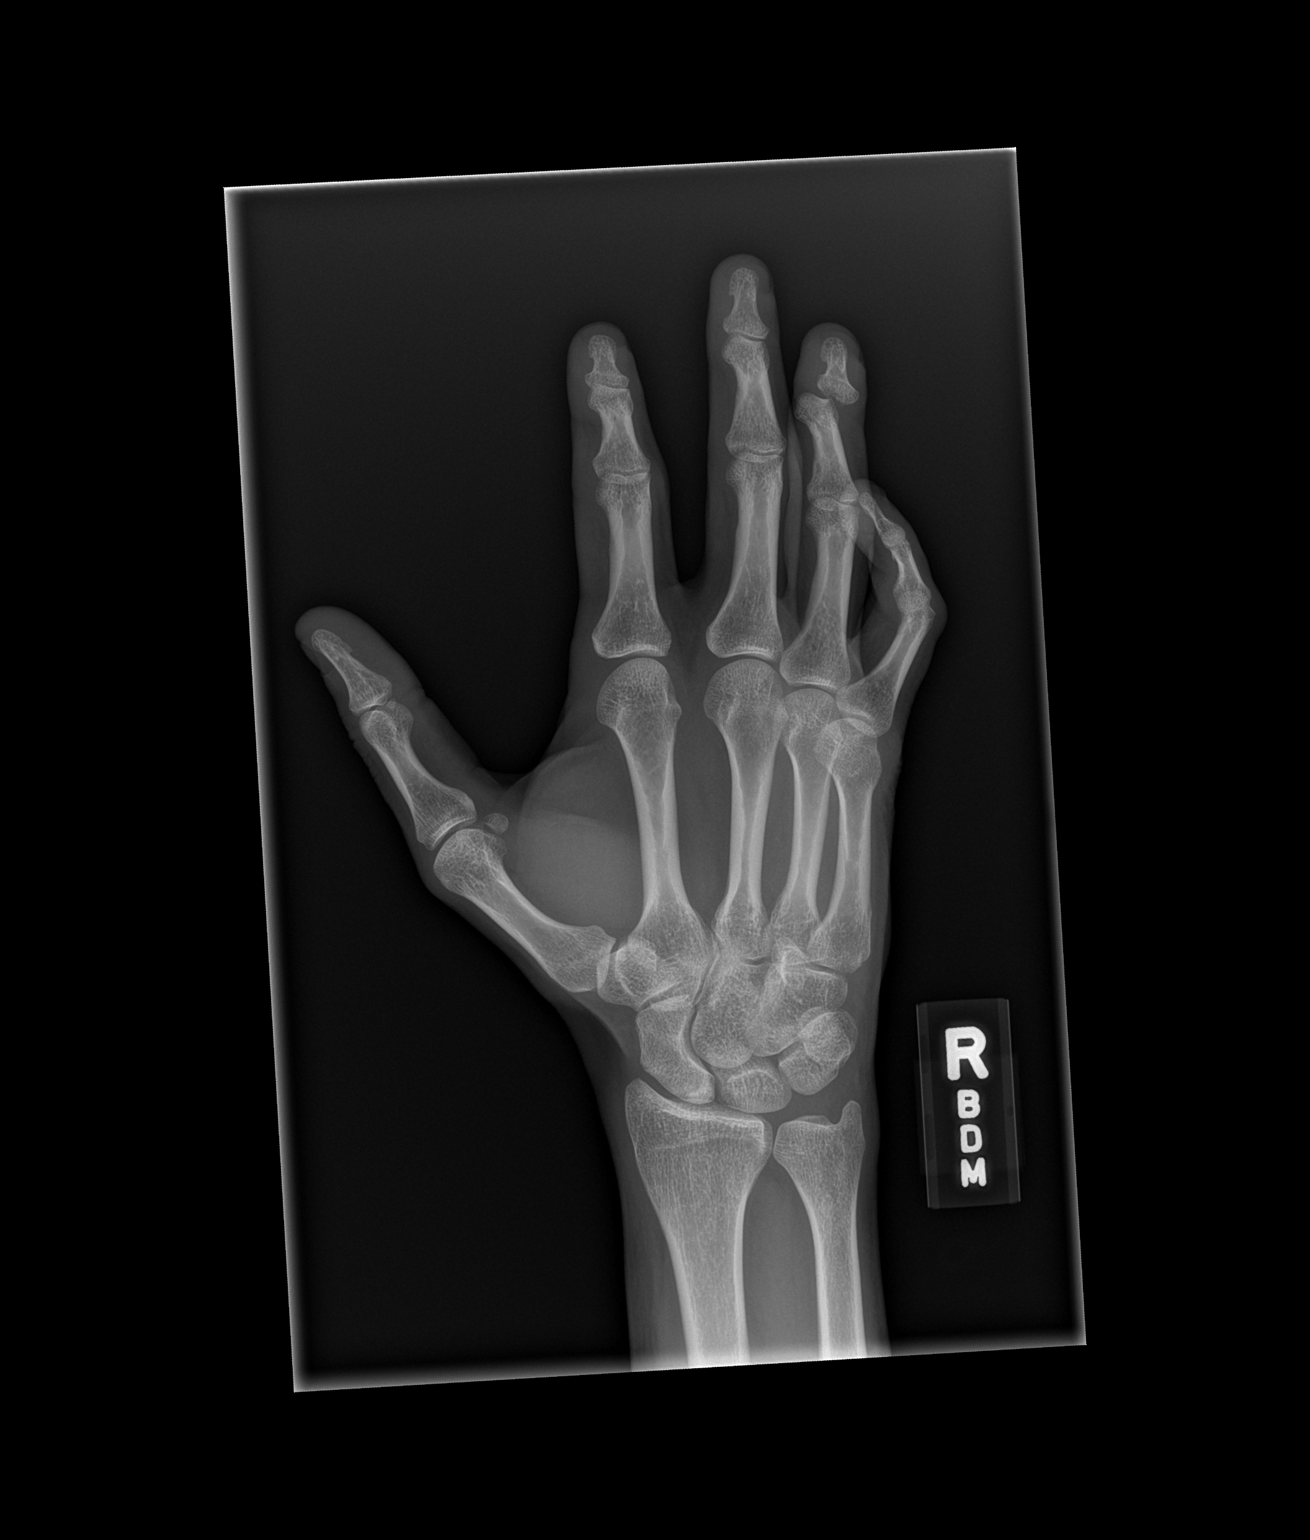

[x hand lat right]
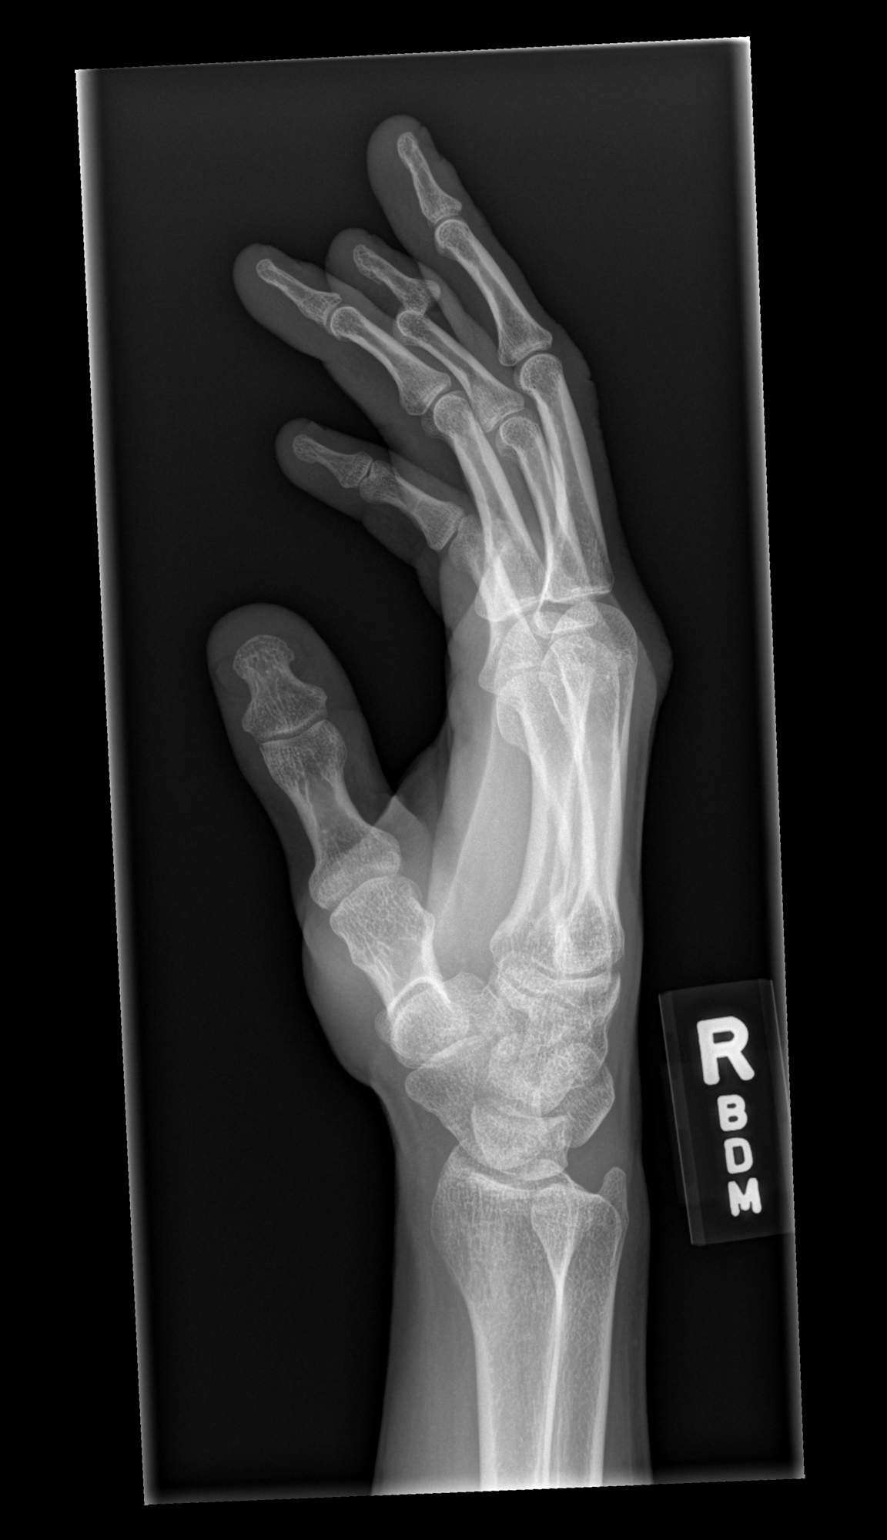

[3 of 3 positions shown; findings below may reference images not displayed]

FINDINGS: There is a posterior dislocation involving the fourth DIP joint. No
fracture identified. No radio-opaque foreign bodies.
IMPRESSION: 1. Posterior dislocation of the fourth DIP joint.

## 2017-02-09 IMAGING — DX DG WRIST COMPLETE 3+V*R*
4 series · 4 of 4 positions shown · non-contrast
Comparison: None.

CLINICAL DATA: Fell off trailer  today

EXAM:
RIGHT WRIST - COMPLETE 3+ VIEW

[x wrist pa right]
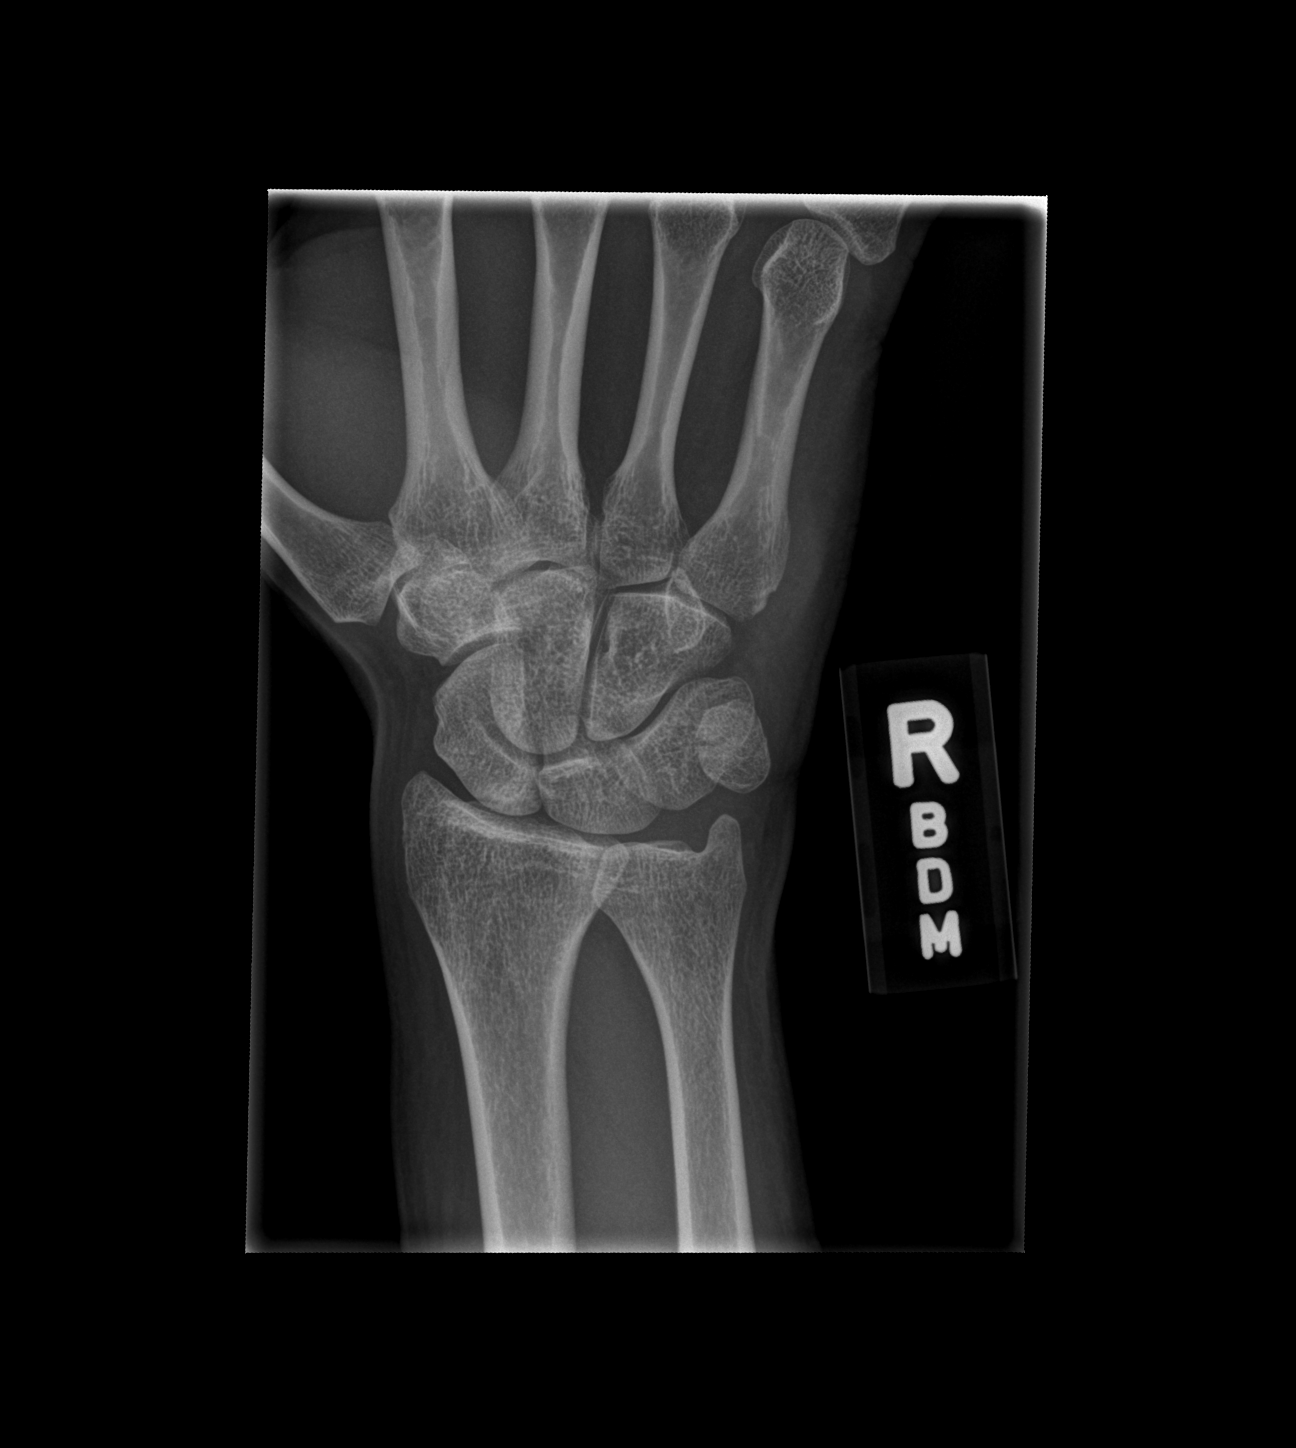

[x wrist obl right]
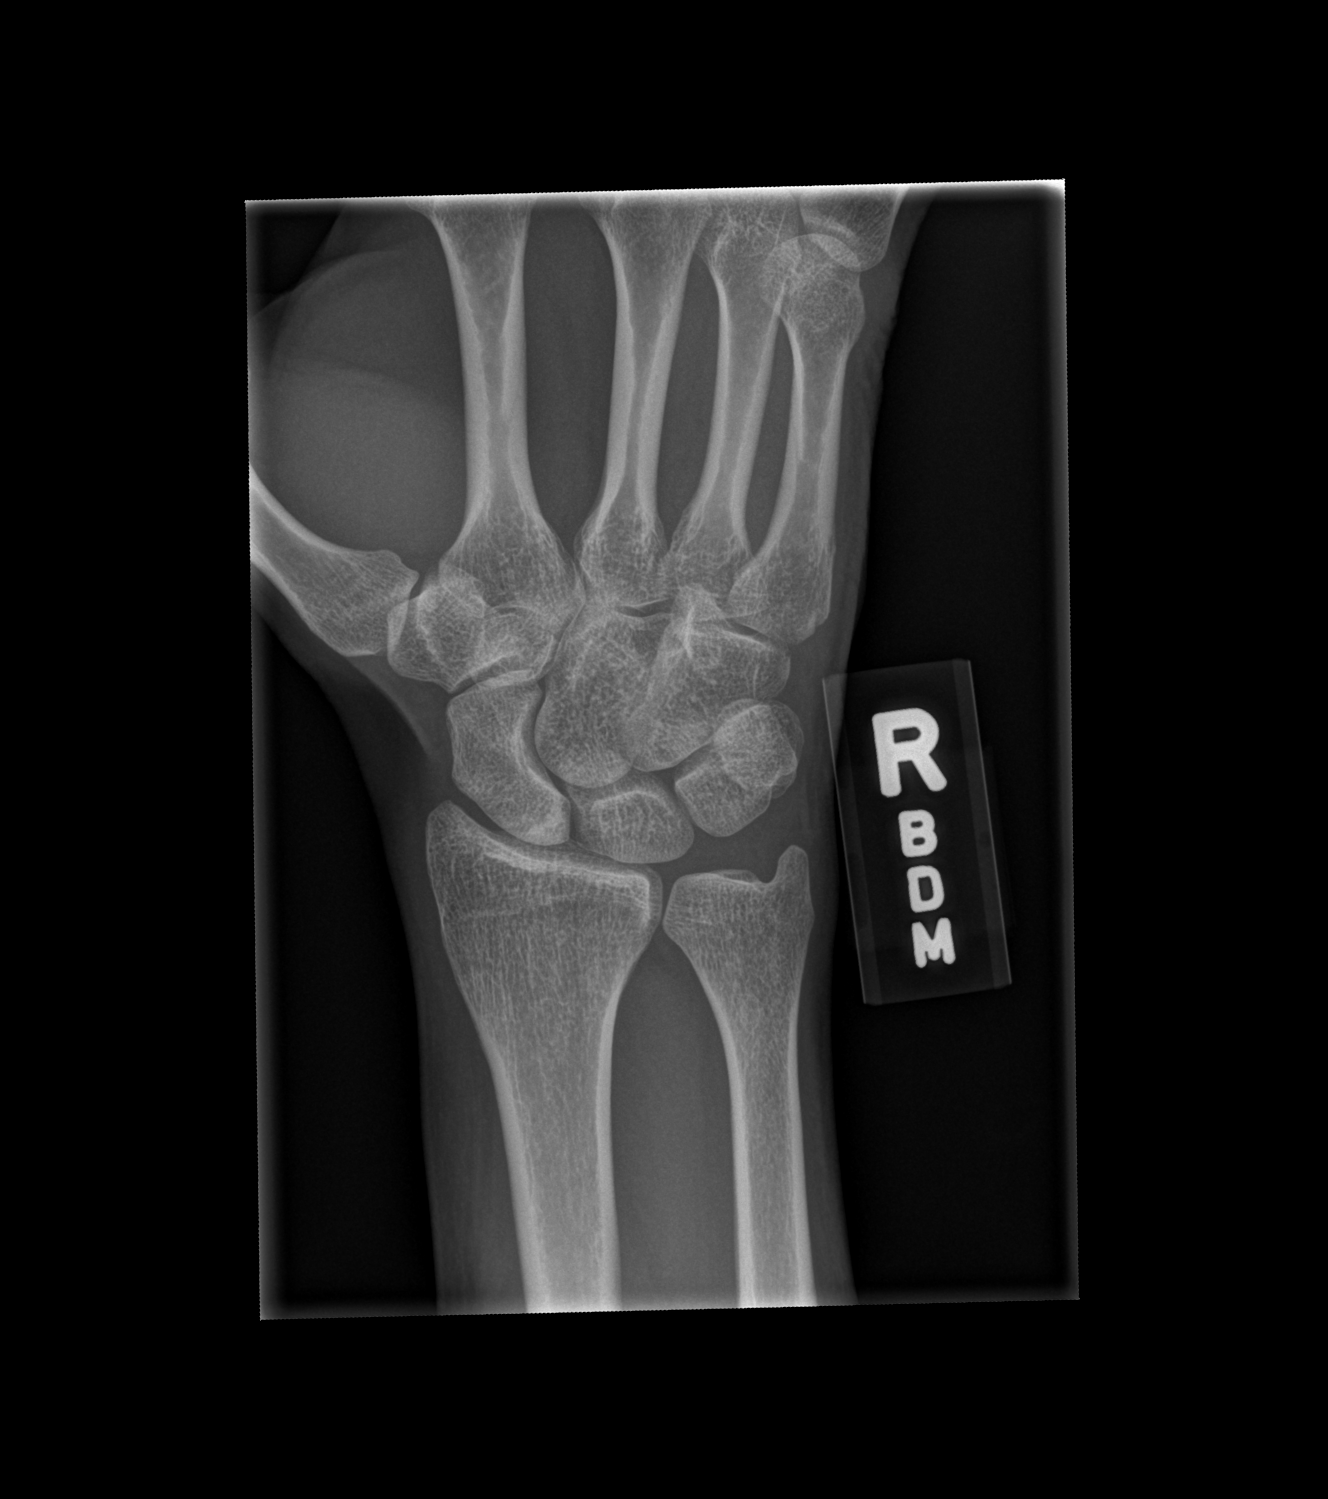

[x wrist lat right]
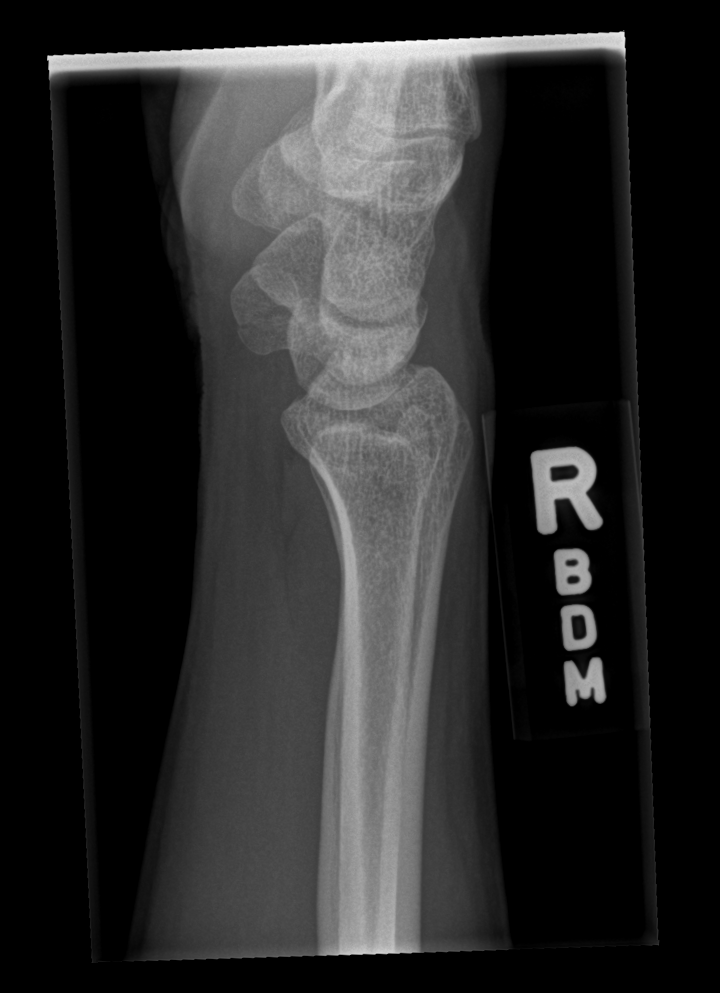

[x wrist navicular view right]
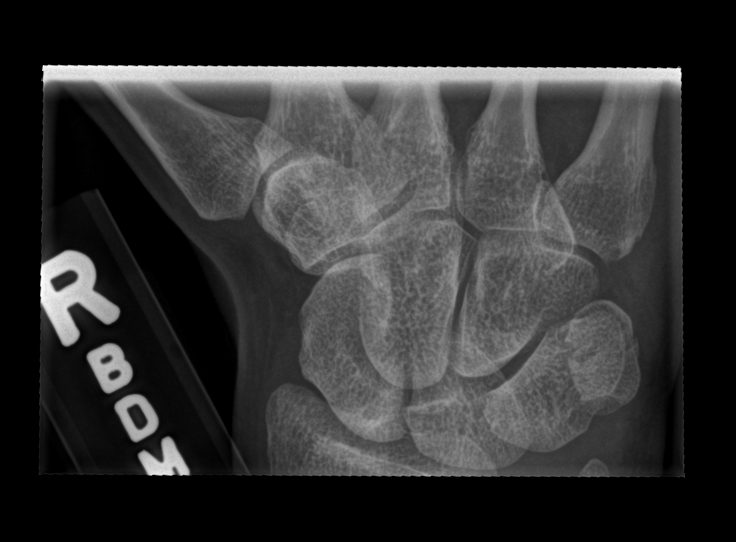

[4 of 4 positions shown; findings below may reference images not displayed]

FINDINGS: Fall views of the right wrist submitted. There is acute minimal
displaced fracture of the pisiform bone.
IMPRESSION: Acute minimal displaced fracture of the pisiform bone.

## 2020-05-22 DIAGNOSIS — T33821A Superficial frostbite of right foot, initial encounter: Secondary | ICD-10-CM | POA: Diagnosis not present

## 2020-05-22 DIAGNOSIS — Z743 Need for continuous supervision: Secondary | ICD-10-CM | POA: Diagnosis not present

## 2020-05-22 DIAGNOSIS — M7989 Other specified soft tissue disorders: Secondary | ICD-10-CM | POA: Diagnosis not present

## 2020-05-22 DIAGNOSIS — R7981 Abnormal blood-gas level: Secondary | ICD-10-CM | POA: Diagnosis not present

## 2020-05-22 DIAGNOSIS — R69 Illness, unspecified: Secondary | ICD-10-CM | POA: Diagnosis not present

## 2020-05-22 DIAGNOSIS — X31XXXA Exposure to excessive natural cold, initial encounter: Secondary | ICD-10-CM | POA: Diagnosis not present

## 2020-05-22 DIAGNOSIS — M79671 Pain in right foot: Secondary | ICD-10-CM | POA: Diagnosis not present

## 2020-05-22 DIAGNOSIS — T3390XA Superficial frostbite of unspecified sites, initial encounter: Secondary | ICD-10-CM | POA: Diagnosis not present

## 2020-05-22 DIAGNOSIS — M79672 Pain in left foot: Secondary | ICD-10-CM | POA: Diagnosis not present

## 2020-05-22 DIAGNOSIS — T33822A Superficial frostbite of left foot, initial encounter: Secondary | ICD-10-CM | POA: Diagnosis not present

## 2020-05-22 DIAGNOSIS — T68XXXA Hypothermia, initial encounter: Secondary | ICD-10-CM | POA: Diagnosis not present

## 2020-05-22 DIAGNOSIS — R030 Elevated blood-pressure reading, without diagnosis of hypertension: Secondary | ICD-10-CM | POA: Diagnosis not present

## 2020-05-26 DIAGNOSIS — R6510 Systemic inflammatory response syndrome (SIRS) of non-infectious origin without acute organ dysfunction: Secondary | ICD-10-CM | POA: Diagnosis not present

## 2020-05-26 DIAGNOSIS — F250 Schizoaffective disorder, bipolar type: Secondary | ICD-10-CM | POA: Diagnosis not present

## 2020-05-26 DIAGNOSIS — X31XXXA Exposure to excessive natural cold, initial encounter: Secondary | ICD-10-CM | POA: Diagnosis not present

## 2020-05-26 DIAGNOSIS — Z20822 Contact with and (suspected) exposure to covid-19: Secondary | ICD-10-CM | POA: Diagnosis not present

## 2020-05-26 DIAGNOSIS — I96 Gangrene, not elsewhere classified: Secondary | ICD-10-CM | POA: Diagnosis not present

## 2020-05-26 DIAGNOSIS — T34821A Frostbite with tissue necrosis of right foot, initial encounter: Secondary | ICD-10-CM | POA: Diagnosis not present

## 2020-05-26 DIAGNOSIS — R600 Localized edema: Secondary | ICD-10-CM | POA: Diagnosis not present

## 2020-05-26 DIAGNOSIS — T3390XA Superficial frostbite of unspecified sites, initial encounter: Secondary | ICD-10-CM | POA: Diagnosis not present

## 2020-05-26 DIAGNOSIS — T33821D Superficial frostbite of right foot, subsequent encounter: Secondary | ICD-10-CM | POA: Diagnosis not present

## 2020-05-26 DIAGNOSIS — A419 Sepsis, unspecified organism: Secondary | ICD-10-CM | POA: Diagnosis not present

## 2020-05-26 DIAGNOSIS — R69 Illness, unspecified: Secondary | ICD-10-CM | POA: Diagnosis not present

## 2020-05-26 DIAGNOSIS — T34822A Frostbite with tissue necrosis of left foot, initial encounter: Secondary | ICD-10-CM | POA: Diagnosis not present

## 2020-05-26 DIAGNOSIS — R238 Other skin changes: Secondary | ICD-10-CM | POA: Diagnosis not present

## 2020-05-26 DIAGNOSIS — T33821A Superficial frostbite of right foot, initial encounter: Secondary | ICD-10-CM | POA: Diagnosis not present

## 2020-05-26 DIAGNOSIS — D75838 Other thrombocytosis: Secondary | ICD-10-CM | POA: Diagnosis not present

## 2020-05-26 DIAGNOSIS — T33822A Superficial frostbite of left foot, initial encounter: Secondary | ICD-10-CM | POA: Diagnosis not present

## 2020-05-26 DIAGNOSIS — R230 Cyanosis: Secondary | ICD-10-CM | POA: Diagnosis not present

## 2020-06-03 DIAGNOSIS — R030 Elevated blood-pressure reading, without diagnosis of hypertension: Secondary | ICD-10-CM | POA: Diagnosis not present

## 2020-06-03 DIAGNOSIS — L03119 Cellulitis of unspecified part of limb: Secondary | ICD-10-CM | POA: Diagnosis not present

## 2020-06-03 DIAGNOSIS — G3184 Mild cognitive impairment, so stated: Secondary | ICD-10-CM | POA: Diagnosis not present

## 2020-06-03 DIAGNOSIS — G8929 Other chronic pain: Secondary | ICD-10-CM | POA: Diagnosis not present

## 2020-06-03 DIAGNOSIS — F17210 Nicotine dependence, cigarettes, uncomplicated: Secondary | ICD-10-CM | POA: Diagnosis not present

## 2020-06-03 DIAGNOSIS — X31XXXD Exposure to excessive natural cold, subsequent encounter: Secondary | ICD-10-CM | POA: Diagnosis not present

## 2020-06-03 DIAGNOSIS — T33821S Superficial frostbite of right foot, sequela: Secondary | ICD-10-CM | POA: Diagnosis not present

## 2020-06-03 DIAGNOSIS — T34822D Frostbite with tissue necrosis of left foot, subsequent encounter: Secondary | ICD-10-CM | POA: Diagnosis not present

## 2020-06-03 DIAGNOSIS — M25571 Pain in right ankle and joints of right foot: Secondary | ICD-10-CM | POA: Diagnosis not present

## 2020-06-03 DIAGNOSIS — T34821D Frostbite with tissue necrosis of right foot, subsequent encounter: Secondary | ICD-10-CM | POA: Diagnosis not present

## 2020-06-03 DIAGNOSIS — Z03818 Encounter for observation for suspected exposure to other biological agents ruled out: Secondary | ICD-10-CM | POA: Diagnosis not present

## 2020-06-03 DIAGNOSIS — F250 Schizoaffective disorder, bipolar type: Secondary | ICD-10-CM | POA: Diagnosis not present

## 2020-06-03 DIAGNOSIS — M79671 Pain in right foot: Secondary | ICD-10-CM | POA: Diagnosis not present

## 2020-06-03 DIAGNOSIS — T33822A Superficial frostbite of left foot, initial encounter: Secondary | ICD-10-CM | POA: Diagnosis not present

## 2020-06-03 DIAGNOSIS — T33821A Superficial frostbite of right foot, initial encounter: Secondary | ICD-10-CM | POA: Diagnosis not present

## 2020-06-03 DIAGNOSIS — R69 Illness, unspecified: Secondary | ICD-10-CM | POA: Diagnosis not present

## 2020-06-03 DIAGNOSIS — Z20822 Contact with and (suspected) exposure to covid-19: Secondary | ICD-10-CM | POA: Diagnosis not present

## 2020-06-03 DIAGNOSIS — R6510 Systemic inflammatory response syndrome (SIRS) of non-infectious origin without acute organ dysfunction: Secondary | ICD-10-CM | POA: Diagnosis not present

## 2020-06-03 DIAGNOSIS — F411 Generalized anxiety disorder: Secondary | ICD-10-CM | POA: Diagnosis not present

## 2020-06-03 DIAGNOSIS — I96 Gangrene, not elsewhere classified: Secondary | ICD-10-CM | POA: Diagnosis not present

## 2020-06-03 DIAGNOSIS — M79672 Pain in left foot: Secondary | ICD-10-CM | POA: Diagnosis not present

## 2020-06-03 DIAGNOSIS — T34821A Frostbite with tissue necrosis of right foot, initial encounter: Secondary | ICD-10-CM | POA: Diagnosis not present

## 2020-06-03 DIAGNOSIS — T33822S Superficial frostbite of left foot, sequela: Secondary | ICD-10-CM | POA: Diagnosis not present

## 2020-06-03 DIAGNOSIS — G9009 Other idiopathic peripheral autonomic neuropathy: Secondary | ICD-10-CM | POA: Diagnosis not present

## 2020-06-03 DIAGNOSIS — T34822A Frostbite with tissue necrosis of left foot, initial encounter: Secondary | ICD-10-CM | POA: Diagnosis not present

## 2020-06-03 DIAGNOSIS — L03115 Cellulitis of right lower limb: Secondary | ICD-10-CM | POA: Diagnosis not present

## 2020-06-03 DIAGNOSIS — G4700 Insomnia, unspecified: Secondary | ICD-10-CM | POA: Diagnosis not present

## 2020-06-03 DIAGNOSIS — L03116 Cellulitis of left lower limb: Secondary | ICD-10-CM | POA: Diagnosis not present

## 2020-06-03 DIAGNOSIS — G4701 Insomnia due to medical condition: Secondary | ICD-10-CM | POA: Diagnosis not present

## 2020-06-03 DIAGNOSIS — M25572 Pain in left ankle and joints of left foot: Secondary | ICD-10-CM | POA: Diagnosis not present

## 2020-06-06 DIAGNOSIS — T33822S Superficial frostbite of left foot, sequela: Secondary | ICD-10-CM | POA: Diagnosis not present

## 2020-06-06 DIAGNOSIS — T33821S Superficial frostbite of right foot, sequela: Secondary | ICD-10-CM | POA: Diagnosis not present

## 2020-06-06 DIAGNOSIS — R69 Illness, unspecified: Secondary | ICD-10-CM | POA: Diagnosis not present

## 2020-06-06 DIAGNOSIS — M79672 Pain in left foot: Secondary | ICD-10-CM | POA: Diagnosis not present

## 2020-06-06 DIAGNOSIS — M79671 Pain in right foot: Secondary | ICD-10-CM | POA: Diagnosis not present

## 2020-06-06 DIAGNOSIS — G4701 Insomnia due to medical condition: Secondary | ICD-10-CM | POA: Diagnosis not present

## 2020-06-06 DIAGNOSIS — R030 Elevated blood-pressure reading, without diagnosis of hypertension: Secondary | ICD-10-CM | POA: Diagnosis not present

## 2020-06-06 DIAGNOSIS — I96 Gangrene, not elsewhere classified: Secondary | ICD-10-CM | POA: Diagnosis not present

## 2020-06-06 DIAGNOSIS — R6510 Systemic inflammatory response syndrome (SIRS) of non-infectious origin without acute organ dysfunction: Secondary | ICD-10-CM | POA: Diagnosis not present

## 2020-06-08 DIAGNOSIS — M79671 Pain in right foot: Secondary | ICD-10-CM | POA: Diagnosis not present

## 2020-06-08 DIAGNOSIS — M79672 Pain in left foot: Secondary | ICD-10-CM | POA: Diagnosis not present

## 2020-06-09 DIAGNOSIS — R69 Illness, unspecified: Secondary | ICD-10-CM | POA: Diagnosis not present

## 2020-06-09 DIAGNOSIS — T33822S Superficial frostbite of left foot, sequela: Secondary | ICD-10-CM | POA: Diagnosis not present

## 2020-06-09 DIAGNOSIS — M79671 Pain in right foot: Secondary | ICD-10-CM | POA: Diagnosis not present

## 2020-06-09 DIAGNOSIS — R030 Elevated blood-pressure reading, without diagnosis of hypertension: Secondary | ICD-10-CM | POA: Diagnosis not present

## 2020-06-09 DIAGNOSIS — M79672 Pain in left foot: Secondary | ICD-10-CM | POA: Diagnosis not present

## 2020-06-09 DIAGNOSIS — I96 Gangrene, not elsewhere classified: Secondary | ICD-10-CM | POA: Diagnosis not present

## 2020-06-09 DIAGNOSIS — G4701 Insomnia due to medical condition: Secondary | ICD-10-CM | POA: Diagnosis not present

## 2020-06-09 DIAGNOSIS — T33821S Superficial frostbite of right foot, sequela: Secondary | ICD-10-CM | POA: Diagnosis not present

## 2020-06-10 DIAGNOSIS — T33821A Superficial frostbite of right foot, initial encounter: Secondary | ICD-10-CM | POA: Diagnosis not present

## 2020-06-10 DIAGNOSIS — R69 Illness, unspecified: Secondary | ICD-10-CM | POA: Diagnosis not present

## 2020-06-10 DIAGNOSIS — T34822D Frostbite with tissue necrosis of left foot, subsequent encounter: Secondary | ICD-10-CM | POA: Diagnosis not present

## 2020-06-10 DIAGNOSIS — L03119 Cellulitis of unspecified part of limb: Secondary | ICD-10-CM | POA: Diagnosis not present

## 2020-06-10 DIAGNOSIS — L03116 Cellulitis of left lower limb: Secondary | ICD-10-CM | POA: Diagnosis not present

## 2020-06-10 DIAGNOSIS — T34821D Frostbite with tissue necrosis of right foot, subsequent encounter: Secondary | ICD-10-CM | POA: Diagnosis not present

## 2020-06-10 DIAGNOSIS — L03115 Cellulitis of right lower limb: Secondary | ICD-10-CM | POA: Diagnosis not present

## 2020-06-10 DIAGNOSIS — F17210 Nicotine dependence, cigarettes, uncomplicated: Secondary | ICD-10-CM | POA: Diagnosis not present

## 2020-06-10 DIAGNOSIS — T33822A Superficial frostbite of left foot, initial encounter: Secondary | ICD-10-CM | POA: Diagnosis not present

## 2020-06-10 DIAGNOSIS — Z03818 Encounter for observation for suspected exposure to other biological agents ruled out: Secondary | ICD-10-CM | POA: Diagnosis not present

## 2020-06-10 DIAGNOSIS — X31XXXD Exposure to excessive natural cold, subsequent encounter: Secondary | ICD-10-CM | POA: Diagnosis not present

## 2020-06-10 DIAGNOSIS — Z20822 Contact with and (suspected) exposure to covid-19: Secondary | ICD-10-CM | POA: Diagnosis not present

## 2020-06-13 DIAGNOSIS — I96 Gangrene, not elsewhere classified: Secondary | ICD-10-CM | POA: Diagnosis not present

## 2020-06-13 DIAGNOSIS — R69 Illness, unspecified: Secondary | ICD-10-CM | POA: Diagnosis not present

## 2020-06-13 DIAGNOSIS — F250 Schizoaffective disorder, bipolar type: Secondary | ICD-10-CM | POA: Diagnosis not present

## 2020-06-13 DIAGNOSIS — F411 Generalized anxiety disorder: Secondary | ICD-10-CM | POA: Diagnosis not present

## 2020-06-14 DIAGNOSIS — M25572 Pain in left ankle and joints of left foot: Secondary | ICD-10-CM | POA: Diagnosis not present

## 2020-06-14 DIAGNOSIS — T34822A Frostbite with tissue necrosis of left foot, initial encounter: Secondary | ICD-10-CM | POA: Diagnosis not present

## 2020-06-14 DIAGNOSIS — T34821A Frostbite with tissue necrosis of right foot, initial encounter: Secondary | ICD-10-CM | POA: Diagnosis not present

## 2020-06-14 DIAGNOSIS — M25571 Pain in right ankle and joints of right foot: Secondary | ICD-10-CM | POA: Diagnosis not present

## 2020-06-17 DIAGNOSIS — R69 Illness, unspecified: Secondary | ICD-10-CM | POA: Diagnosis not present

## 2020-06-17 DIAGNOSIS — F250 Schizoaffective disorder, bipolar type: Secondary | ICD-10-CM | POA: Diagnosis not present

## 2020-06-17 DIAGNOSIS — F411 Generalized anxiety disorder: Secondary | ICD-10-CM | POA: Diagnosis not present

## 2020-06-17 DIAGNOSIS — I96 Gangrene, not elsewhere classified: Secondary | ICD-10-CM | POA: Diagnosis not present

## 2020-06-23 DIAGNOSIS — I96 Gangrene, not elsewhere classified: Secondary | ICD-10-CM | POA: Diagnosis not present

## 2020-06-23 DIAGNOSIS — T33821S Superficial frostbite of right foot, sequela: Secondary | ICD-10-CM | POA: Diagnosis not present

## 2020-06-23 DIAGNOSIS — T33822S Superficial frostbite of left foot, sequela: Secondary | ICD-10-CM | POA: Diagnosis not present

## 2020-06-23 DIAGNOSIS — G3184 Mild cognitive impairment, so stated: Secondary | ICD-10-CM | POA: Diagnosis not present

## 2020-06-24 DIAGNOSIS — I96 Gangrene, not elsewhere classified: Secondary | ICD-10-CM | POA: Diagnosis not present

## 2020-06-24 DIAGNOSIS — G3184 Mild cognitive impairment, so stated: Secondary | ICD-10-CM | POA: Diagnosis not present

## 2020-06-24 DIAGNOSIS — R69 Illness, unspecified: Secondary | ICD-10-CM | POA: Diagnosis not present

## 2020-06-24 DIAGNOSIS — T33822S Superficial frostbite of left foot, sequela: Secondary | ICD-10-CM | POA: Diagnosis not present

## 2020-06-24 DIAGNOSIS — T33821S Superficial frostbite of right foot, sequela: Secondary | ICD-10-CM | POA: Diagnosis not present

## 2020-07-06 DIAGNOSIS — T34821A Frostbite with tissue necrosis of right foot, initial encounter: Secondary | ICD-10-CM | POA: Diagnosis not present

## 2020-07-06 DIAGNOSIS — T34822A Frostbite with tissue necrosis of left foot, initial encounter: Secondary | ICD-10-CM | POA: Diagnosis not present

## 2020-07-06 DIAGNOSIS — M25571 Pain in right ankle and joints of right foot: Secondary | ICD-10-CM | POA: Diagnosis not present

## 2020-07-06 DIAGNOSIS — M25572 Pain in left ankle and joints of left foot: Secondary | ICD-10-CM | POA: Diagnosis not present

## 2020-07-12 DIAGNOSIS — R69 Illness, unspecified: Secondary | ICD-10-CM | POA: Diagnosis not present

## 2020-07-12 DIAGNOSIS — Z4789 Encounter for other orthopedic aftercare: Secondary | ICD-10-CM | POA: Diagnosis not present

## 2020-07-12 DIAGNOSIS — T34821A Frostbite with tissue necrosis of right foot, initial encounter: Secondary | ICD-10-CM | POA: Diagnosis not present

## 2020-07-12 DIAGNOSIS — L97522 Non-pressure chronic ulcer of other part of left foot with fat layer exposed: Secondary | ICD-10-CM | POA: Diagnosis not present

## 2020-07-12 DIAGNOSIS — I96 Gangrene, not elsewhere classified: Secondary | ICD-10-CM | POA: Diagnosis not present

## 2020-07-12 DIAGNOSIS — M79671 Pain in right foot: Secondary | ICD-10-CM | POA: Diagnosis not present

## 2020-07-12 DIAGNOSIS — T34822S Frostbite with tissue necrosis of left foot, sequela: Secondary | ICD-10-CM | POA: Diagnosis not present

## 2020-07-12 DIAGNOSIS — R825 Elevated urine levels of drugs, medicaments and biological substances: Secondary | ICD-10-CM | POA: Diagnosis not present

## 2020-07-12 DIAGNOSIS — X31XXXS Exposure to excessive natural cold, sequela: Secondary | ICD-10-CM | POA: Diagnosis not present

## 2020-07-12 DIAGNOSIS — D649 Anemia, unspecified: Secondary | ICD-10-CM | POA: Diagnosis not present

## 2020-07-12 DIAGNOSIS — M25572 Pain in left ankle and joints of left foot: Secondary | ICD-10-CM | POA: Diagnosis not present

## 2020-07-12 DIAGNOSIS — S91301A Unspecified open wound, right foot, initial encounter: Secondary | ICD-10-CM | POA: Diagnosis not present

## 2020-07-12 DIAGNOSIS — M79672 Pain in left foot: Secondary | ICD-10-CM | POA: Diagnosis not present

## 2020-07-12 DIAGNOSIS — D72829 Elevated white blood cell count, unspecified: Secondary | ICD-10-CM | POA: Diagnosis not present

## 2020-07-12 DIAGNOSIS — T33821A Superficial frostbite of right foot, initial encounter: Secondary | ICD-10-CM | POA: Diagnosis not present

## 2020-07-12 DIAGNOSIS — T33822A Superficial frostbite of left foot, initial encounter: Secondary | ICD-10-CM | POA: Diagnosis not present

## 2020-07-12 DIAGNOSIS — L89623 Pressure ulcer of left heel, stage 3: Secondary | ICD-10-CM | POA: Diagnosis not present

## 2020-07-12 DIAGNOSIS — T34821S Frostbite with tissue necrosis of right foot, sequela: Secondary | ICD-10-CM | POA: Diagnosis not present

## 2020-07-12 DIAGNOSIS — L89613 Pressure ulcer of right heel, stage 3: Secondary | ICD-10-CM | POA: Diagnosis not present

## 2020-07-12 DIAGNOSIS — S91302A Unspecified open wound, left foot, initial encounter: Secondary | ICD-10-CM | POA: Diagnosis not present

## 2020-07-12 DIAGNOSIS — T3390XA Superficial frostbite of unspecified sites, initial encounter: Secondary | ICD-10-CM | POA: Diagnosis not present

## 2020-07-12 DIAGNOSIS — L97512 Non-pressure chronic ulcer of other part of right foot with fat layer exposed: Secondary | ICD-10-CM | POA: Diagnosis not present

## 2020-07-12 DIAGNOSIS — T34822A Frostbite with tissue necrosis of left foot, initial encounter: Secondary | ICD-10-CM | POA: Diagnosis not present

## 2020-07-12 DIAGNOSIS — L02611 Cutaneous abscess of right foot: Secondary | ICD-10-CM | POA: Diagnosis not present

## 2020-07-12 DIAGNOSIS — M25571 Pain in right ankle and joints of right foot: Secondary | ICD-10-CM | POA: Diagnosis not present

## 2020-07-25 DIAGNOSIS — L89613 Pressure ulcer of right heel, stage 3: Secondary | ICD-10-CM | POA: Diagnosis not present

## 2020-07-25 DIAGNOSIS — L89623 Pressure ulcer of left heel, stage 3: Secondary | ICD-10-CM | POA: Diagnosis not present

## 2020-07-25 DIAGNOSIS — Z4781 Encounter for orthopedic aftercare following surgical amputation: Secondary | ICD-10-CM | POA: Diagnosis not present

## 2020-07-25 DIAGNOSIS — Z89431 Acquired absence of right foot: Secondary | ICD-10-CM | POA: Diagnosis not present

## 2020-07-25 DIAGNOSIS — R69 Illness, unspecified: Secondary | ICD-10-CM | POA: Diagnosis not present

## 2020-07-25 DIAGNOSIS — Z4800 Encounter for change or removal of nonsurgical wound dressing: Secondary | ICD-10-CM | POA: Diagnosis not present

## 2020-07-25 DIAGNOSIS — A000 Cholera due to Vibrio cholerae 01, biovar cholerae: Secondary | ICD-10-CM | POA: Diagnosis not present

## 2020-07-25 DIAGNOSIS — B950 Streptococcus, group A, as the cause of diseases classified elsewhere: Secondary | ICD-10-CM | POA: Diagnosis not present

## 2020-07-25 DIAGNOSIS — B9561 Methicillin susceptible Staphylococcus aureus infection as the cause of diseases classified elsewhere: Secondary | ICD-10-CM | POA: Diagnosis not present

## 2020-07-25 DIAGNOSIS — D649 Anemia, unspecified: Secondary | ICD-10-CM | POA: Diagnosis not present

## 2020-07-25 DIAGNOSIS — T33821A Superficial frostbite of right foot, initial encounter: Secondary | ICD-10-CM | POA: Diagnosis not present

## 2020-07-28 DIAGNOSIS — B9561 Methicillin susceptible Staphylococcus aureus infection as the cause of diseases classified elsewhere: Secondary | ICD-10-CM | POA: Diagnosis not present

## 2020-07-28 DIAGNOSIS — Z89431 Acquired absence of right foot: Secondary | ICD-10-CM | POA: Diagnosis not present

## 2020-07-28 DIAGNOSIS — Z4800 Encounter for change or removal of nonsurgical wound dressing: Secondary | ICD-10-CM | POA: Diagnosis not present

## 2020-07-28 DIAGNOSIS — Z4781 Encounter for orthopedic aftercare following surgical amputation: Secondary | ICD-10-CM | POA: Diagnosis not present

## 2020-07-28 DIAGNOSIS — B950 Streptococcus, group A, as the cause of diseases classified elsewhere: Secondary | ICD-10-CM | POA: Diagnosis not present

## 2020-07-28 DIAGNOSIS — L89623 Pressure ulcer of left heel, stage 3: Secondary | ICD-10-CM | POA: Diagnosis not present

## 2020-07-28 DIAGNOSIS — R69 Illness, unspecified: Secondary | ICD-10-CM | POA: Diagnosis not present

## 2020-07-28 DIAGNOSIS — Z89439 Acquired absence of unspecified foot: Secondary | ICD-10-CM | POA: Diagnosis not present

## 2020-07-28 DIAGNOSIS — D649 Anemia, unspecified: Secondary | ICD-10-CM | POA: Diagnosis not present

## 2020-07-28 DIAGNOSIS — R031 Nonspecific low blood-pressure reading: Secondary | ICD-10-CM | POA: Diagnosis not present

## 2020-07-28 DIAGNOSIS — L89613 Pressure ulcer of right heel, stage 3: Secondary | ICD-10-CM | POA: Diagnosis not present

## 2020-08-01 DIAGNOSIS — M25572 Pain in left ankle and joints of left foot: Secondary | ICD-10-CM | POA: Diagnosis not present

## 2020-08-01 DIAGNOSIS — T34821A Frostbite with tissue necrosis of right foot, initial encounter: Secondary | ICD-10-CM | POA: Diagnosis not present

## 2020-08-01 DIAGNOSIS — S91301A Unspecified open wound, right foot, initial encounter: Secondary | ICD-10-CM | POA: Diagnosis not present

## 2020-08-01 DIAGNOSIS — S91302A Unspecified open wound, left foot, initial encounter: Secondary | ICD-10-CM | POA: Diagnosis not present

## 2020-08-01 DIAGNOSIS — T34822A Frostbite with tissue necrosis of left foot, initial encounter: Secondary | ICD-10-CM | POA: Diagnosis not present

## 2020-08-01 DIAGNOSIS — M25571 Pain in right ankle and joints of right foot: Secondary | ICD-10-CM | POA: Diagnosis not present

## 2020-08-02 DIAGNOSIS — L97422 Non-pressure chronic ulcer of left heel and midfoot with fat layer exposed: Secondary | ICD-10-CM | POA: Diagnosis not present

## 2020-08-02 DIAGNOSIS — X31XXXA Exposure to excessive natural cold, initial encounter: Secondary | ICD-10-CM | POA: Diagnosis not present

## 2020-08-02 DIAGNOSIS — T33822A Superficial frostbite of left foot, initial encounter: Secondary | ICD-10-CM | POA: Diagnosis not present

## 2020-08-02 DIAGNOSIS — I872 Venous insufficiency (chronic) (peripheral): Secondary | ICD-10-CM | POA: Diagnosis not present

## 2020-08-02 DIAGNOSIS — L97412 Non-pressure chronic ulcer of right heel and midfoot with fat layer exposed: Secondary | ICD-10-CM | POA: Diagnosis not present

## 2020-08-02 DIAGNOSIS — Z5189 Encounter for other specified aftercare: Secondary | ICD-10-CM | POA: Diagnosis not present

## 2020-08-02 DIAGNOSIS — L97329 Non-pressure chronic ulcer of left ankle with unspecified severity: Secondary | ICD-10-CM | POA: Diagnosis not present

## 2020-08-02 DIAGNOSIS — T33821A Superficial frostbite of right foot, initial encounter: Secondary | ICD-10-CM | POA: Diagnosis not present

## 2020-08-02 DIAGNOSIS — R69 Illness, unspecified: Secondary | ICD-10-CM | POA: Diagnosis not present

## 2020-08-02 DIAGNOSIS — L97321 Non-pressure chronic ulcer of left ankle limited to breakdown of skin: Secondary | ICD-10-CM | POA: Diagnosis not present

## 2020-08-04 DIAGNOSIS — B950 Streptococcus, group A, as the cause of diseases classified elsewhere: Secondary | ICD-10-CM | POA: Diagnosis not present

## 2020-08-04 DIAGNOSIS — D649 Anemia, unspecified: Secondary | ICD-10-CM | POA: Diagnosis not present

## 2020-08-04 DIAGNOSIS — L89613 Pressure ulcer of right heel, stage 3: Secondary | ICD-10-CM | POA: Diagnosis not present

## 2020-08-04 DIAGNOSIS — B9561 Methicillin susceptible Staphylococcus aureus infection as the cause of diseases classified elsewhere: Secondary | ICD-10-CM | POA: Diagnosis not present

## 2020-08-04 DIAGNOSIS — Z4800 Encounter for change or removal of nonsurgical wound dressing: Secondary | ICD-10-CM | POA: Diagnosis not present

## 2020-08-04 DIAGNOSIS — L89623 Pressure ulcer of left heel, stage 3: Secondary | ICD-10-CM | POA: Diagnosis not present

## 2020-08-04 DIAGNOSIS — Z89431 Acquired absence of right foot: Secondary | ICD-10-CM | POA: Diagnosis not present

## 2020-08-04 DIAGNOSIS — Z4781 Encounter for orthopedic aftercare following surgical amputation: Secondary | ICD-10-CM | POA: Diagnosis not present

## 2020-08-04 DIAGNOSIS — R69 Illness, unspecified: Secondary | ICD-10-CM | POA: Diagnosis not present

## 2020-08-05 DIAGNOSIS — B9561 Methicillin susceptible Staphylococcus aureus infection as the cause of diseases classified elsewhere: Secondary | ICD-10-CM | POA: Diagnosis not present

## 2020-08-05 DIAGNOSIS — Z89431 Acquired absence of right foot: Secondary | ICD-10-CM | POA: Diagnosis not present

## 2020-08-05 DIAGNOSIS — L89623 Pressure ulcer of left heel, stage 3: Secondary | ICD-10-CM | POA: Diagnosis not present

## 2020-08-05 DIAGNOSIS — R69 Illness, unspecified: Secondary | ICD-10-CM | POA: Diagnosis not present

## 2020-08-05 DIAGNOSIS — Z4800 Encounter for change or removal of nonsurgical wound dressing: Secondary | ICD-10-CM | POA: Diagnosis not present

## 2020-08-05 DIAGNOSIS — B950 Streptococcus, group A, as the cause of diseases classified elsewhere: Secondary | ICD-10-CM | POA: Diagnosis not present

## 2020-08-05 DIAGNOSIS — D649 Anemia, unspecified: Secondary | ICD-10-CM | POA: Diagnosis not present

## 2020-08-05 DIAGNOSIS — Z4781 Encounter for orthopedic aftercare following surgical amputation: Secondary | ICD-10-CM | POA: Diagnosis not present

## 2020-08-05 DIAGNOSIS — L89613 Pressure ulcer of right heel, stage 3: Secondary | ICD-10-CM | POA: Diagnosis not present

## 2020-08-17 DIAGNOSIS — M25572 Pain in left ankle and joints of left foot: Secondary | ICD-10-CM | POA: Diagnosis not present

## 2020-08-17 DIAGNOSIS — S91302A Unspecified open wound, left foot, initial encounter: Secondary | ICD-10-CM | POA: Diagnosis not present

## 2020-08-17 DIAGNOSIS — T34821A Frostbite with tissue necrosis of right foot, initial encounter: Secondary | ICD-10-CM | POA: Diagnosis not present

## 2020-08-17 DIAGNOSIS — S91301A Unspecified open wound, right foot, initial encounter: Secondary | ICD-10-CM | POA: Diagnosis not present

## 2020-08-17 DIAGNOSIS — M25571 Pain in right ankle and joints of right foot: Secondary | ICD-10-CM | POA: Diagnosis not present

## 2020-08-17 DIAGNOSIS — T34822A Frostbite with tissue necrosis of left foot, initial encounter: Secondary | ICD-10-CM | POA: Diagnosis not present

## 2020-09-02 DIAGNOSIS — Z89439 Acquired absence of unspecified foot: Secondary | ICD-10-CM | POA: Diagnosis not present

## 2020-09-02 DIAGNOSIS — S91309A Unspecified open wound, unspecified foot, initial encounter: Secondary | ICD-10-CM | POA: Diagnosis not present

## 2020-09-07 DIAGNOSIS — G629 Polyneuropathy, unspecified: Secondary | ICD-10-CM | POA: Diagnosis not present

## 2020-09-07 DIAGNOSIS — L97509 Non-pressure chronic ulcer of other part of unspecified foot with unspecified severity: Secondary | ICD-10-CM | POA: Diagnosis not present

## 2020-09-07 DIAGNOSIS — R69 Illness, unspecified: Secondary | ICD-10-CM | POA: Diagnosis not present

## 2020-09-07 DIAGNOSIS — Z89431 Acquired absence of right foot: Secondary | ICD-10-CM | POA: Diagnosis not present

## 2020-09-07 DIAGNOSIS — Z89432 Acquired absence of left foot: Secondary | ICD-10-CM | POA: Diagnosis not present

## 2020-09-07 DIAGNOSIS — G8929 Other chronic pain: Secondary | ICD-10-CM | POA: Diagnosis not present

## 2020-09-14 ENCOUNTER — Telehealth: Payer: Self-pay | Admitting: Internal Medicine

## 2020-09-14 ENCOUNTER — Other Ambulatory Visit: Payer: Self-pay

## 2020-09-14 ENCOUNTER — Encounter: Payer: Self-pay | Admitting: Internal Medicine

## 2020-09-14 ENCOUNTER — Telehealth (INDEPENDENT_AMBULATORY_CARE_PROVIDER_SITE_OTHER): Payer: Medicare HMO | Admitting: Internal Medicine

## 2020-09-14 DIAGNOSIS — S98912S Complete traumatic amputation of left foot, level unspecified, sequela: Secondary | ICD-10-CM

## 2020-09-14 DIAGNOSIS — Z7689 Persons encountering health services in other specified circumstances: Secondary | ICD-10-CM

## 2020-09-14 DIAGNOSIS — F259 Schizoaffective disorder, unspecified: Secondary | ICD-10-CM

## 2020-09-14 DIAGNOSIS — F4310 Post-traumatic stress disorder, unspecified: Secondary | ICD-10-CM | POA: Diagnosis not present

## 2020-09-14 DIAGNOSIS — R69 Illness, unspecified: Secondary | ICD-10-CM | POA: Diagnosis not present

## 2020-09-14 DIAGNOSIS — S98911S Complete traumatic amputation of right foot, level unspecified, sequela: Secondary | ICD-10-CM | POA: Diagnosis not present

## 2020-09-14 NOTE — Progress Notes (Signed)
Virtual Visit via Telephone Note  I connected with Richard [name], on 09/14/2020 at 3:12 PM by telephone due to the COVID-19 pandemic and verified that I am speaking with the correct person using two identifiers.   Consent: I discussed the limitations, risks, security and privacy concerns of performing an evaluation and management service by telephone and the availability of in person appointments. I also discussed with the patient that there may be a patient responsible charge related to this service. The patient expressed understanding and agreed to proceed.   Location of Patient: Home   Location of Provider: Clinic   Persons participating in Telemedicine visit: Tyion Boylen [facility] Dr. Earlene Plater      History of Present Illness: Patient has a visit to establish care. Reports that he has a significant psych history with prior diagnoses of schizoaffective disorder, bipolar disorder, PTSD. Reports has been on all sorts of different medications but not currently on anything. Needs to establish with psych.   He has a history of double amputations for his feet related to frost bite. He has a lot of difficulty with mobility. He has a manual wheelchair but reports that it is in bad shape and difficult to propel. Typically is mobile via crawling. Requests electric wheelchair. Also asks for shower chair.   Depression screen PHQ 2/9 09/14/2020  Decreased Interest 3  Down, Depressed, Hopeless 3  PHQ - 2 Score 6  Altered sleeping 3  Tired, decreased energy 3  Change in appetite 3  Feeling bad or failure about yourself  3  Trouble concentrating 0  Moving slowly or fidgety/restless 0  Suicidal thoughts 0  PHQ-9 Score 18   GAD 7 : Generalized Anxiety Score 09/14/2020  Nervous, Anxious, on Edge 3  Control/stop worrying 3  Worry too much - different things 3  Trouble relaxing 3  Restless 3  Easily annoyed or irritable 3  Afraid - awful might happen 3  Total GAD 7 Score 21       Past Medical History:  Diagnosis Date  . Acid reflux   . Bipolar 1 disorder (HCC)   . Depression   . OCD (obsessive compulsive disorder)   . Schizo affective schizophrenia (HCC)   . Schizoaffective disorder (HCC)    Allergies  Allergen Reactions  . Haldol [Haloperidol] Other (See Comments)    Paralysis   . Haloperidol And Related     Could not move( paralyzation)    Current Outpatient Medications on File Prior to Visit  Medication Sig Dispense Refill  . gabapentin (NEURONTIN) 300 MG capsule Take 300 mg by mouth 3 (three) times daily.    Marland Kitchen HYDROcodone-acetaminophen (NORCO/VICODIN) 5-325 MG per tablet Take 1 tablet by mouth every 4 (four) hours as needed. 15 tablet 0   No current facility-administered medications on file prior to visit.    Observations/Objective: NAD. Speaking clearly.  Work of breathing normal.  Alert and oriented. Mood appropriate.   Assessment and Plan: 1. Encounter to establish care Reviewed patient's PMH, social history, surgical history, and medications.  Is overdue for annual exam, screening blood work, and health maintenance topics. Have asked patient to return for visit to address these items.   2. Schizoaffective disorder, unspecified type (HCC) - Ambulatory referral to Psychiatry  3. PTSD (post-traumatic stress disorder) - Ambulatory referral to Psychiatry  4. Amputation of left foot, sequela (HCC) 5. Amputation of right foot, sequela (HCC) Given patient's lack of mobility related to amputations, would benefit from electric wheelchair and shower stool. Will  start DME ordering process.  - For home use only DME Shower stool - DME Wheelchair electric   Follow Up Instructions: Annual exam    I discussed the assessment and treatment plan with the patient. The patient was provided an opportunity to ask questions and all were answered. The patient agreed with the plan and demonstrated an understanding of the instructions.   The patient was  advised to call back or seek an in-person evaluation if the symptoms worsen or if the condition fails to improve as anticipated.     I provided 9 minutes total of non-face-to-face time during this encounter including median intraservice time, reviewing previous notes, investigations, ordering medications, medical decision making, coordinating care and patient verbalized understanding at the end of the visit.    Marcy Siren, D.O. Primary Care at [facility]  09/14/2020, 3:12 PM

## 2020-09-14 NOTE — Progress Notes (Signed)
Establish care-   Needs to see a psychiatrist-no one in mind Electric scooter

## 2020-09-20 ENCOUNTER — Telehealth: Payer: Self-pay | Admitting: Internal Medicine

## 2020-09-20 NOTE — Telephone Encounter (Signed)
Can you write a DME order for this?

## 2020-09-20 NOTE — Telephone Encounter (Signed)
Pt 's mom calling asking for PCP's help she states Richard [name] needs a wheelchair the one he has was from [place] and is in bad shape and not sure how to go about this process. Please advise and thank you

## 2020-09-21 ENCOUNTER — Encounter (HOSPITAL_BASED_OUTPATIENT_CLINIC_OR_DEPARTMENT_OTHER): Payer: Medicare HMO | Attending: Internal Medicine | Admitting: Internal Medicine

## 2020-09-21 ENCOUNTER — Other Ambulatory Visit: Payer: Self-pay

## 2020-09-21 DIAGNOSIS — L89619 Pressure ulcer of right heel, unspecified stage: Secondary | ICD-10-CM

## 2020-09-21 DIAGNOSIS — L89629 Pressure ulcer of left heel, unspecified stage: Secondary | ICD-10-CM | POA: Diagnosis not present

## 2020-09-21 NOTE — Progress Notes (Signed)
TRAVER, MECKES ([id number]) Visit Report for 09/21/2020 Abuse/Suicide Risk Screen Details Patient Name: Date of Service: Richard [name] [place] THA N 09/21/2020 1:15 PM Medical Record Number: [id number] Patient Account Number: [id number] Date of Birth/Sex: Treating RN: [birth year] [name] (45 y.o. Male) Zandra Abts Primary Care Richard [name]: Marcy Siren Other Clinician: Referring Richard [name]: Treating Richard [name]/Extender: Otilio Connors in Treatment: 0 Abuse/Suicide Risk Screen Items Answer ABUSE RISK SCREEN: Has anyone close to you tried to hurt or harm you recentlyo No Do you feel uncomfortable with anyone in your familyo No Has anyone forced you do things that you didnt want to doo No Electronic Signature(s) Signed: 09/21/2020 5:57:05 PM By: Zandra Abts RN, BSN Entered By: Zandra Abts on 09/21/2020 13:40:10 -------------------------------------------------------------------------------- Activities of Daily Living Details Patient Name: Date of Service: Richard [name] THA N 09/21/2020 1:15 PM Medical Record Number: [id number] Patient Account Number: [id number] Date of Birth/Sex: Treating RN: [birth year]Richard [name] (45 y.o. Male) Zandra Abts Primary Care Richard [name]: Marcy Siren Other Clinician: Referring Richard [name]: Treating Richard [name]/Extender: Otilio Connors in Treatment: 0 Activities of Daily Living Items Answer Activities of Daily Living (Please select one for each item) Drive Automobile Not Able T Medications ake Completely Able Use T elephone Completely Able Care for Appearance Completely Able Use T oilet Completely Able Bath / Shower Completely Able Dress Self Completely Able Feed Self Completely Able Walk Not Able Get In / Out Bed Completely Able Housework Completely Able Prepare Meals Completely Able Handle Money Completely Able Shop for Self Need Assistance Electronic Signature(s) Signed: 09/21/2020 5:57:05 PM By: Zandra Abts RN,  BSN Entered By: Zandra Abts on 09/21/2020 13:40:29 -------------------------------------------------------------------------------- Education Screening Details Patient Name: Date of Service: Richard [name] NA THA N 09/21/2020 1:15 PM Medical Record Number: [id number] Patient Account Number: [id number] Date of Birth/Sex: Treating RN: Richard [name][birth year] (45 y.o. Male) Zandra Abts Primary Care Aariyana Manz: Marcy Siren Other Clinician: Referring Xzayvion Vaeth: Treating Richard [name]/Extender: Otilio Connors in Treatment: 0 Primary Learner Assessed: Patient Learning Preferences/Education Level/Primary Language Learning Preference: Explanation, Demonstration, Printed Material Highest Education Level: High School Preferred Language: English Cognitive Barrier Language Barrier: No Translator Needed: No Memory Deficit: No Emotional Barrier: No Cultural/Religious Beliefs Affecting Medical Care: No Physical Barrier Impaired Vision: No Impaired Hearing: No Decreased Hand dexterity: No Knowledge/Comprehension Knowledge Level: High Comprehension Level: High Ability to understand written instructions: High Ability to understand verbal instructions: High Motivation Anxiety Level: Calm Cooperation: Cooperative Education Importance: Acknowledges Need Interest in Health Problems: Asks Questions Perception: Coherent Willingness to Engage in Self-Management High Activities: Readiness to Engage in Self-Management High Activities: Electronic Signature(s) Signed: 09/21/2020 5:57:05 PM By: Zandra Abts RN, BSN Entered By: Zandra Abts on 09/21/2020 13:40:45 -------------------------------------------------------------------------------- Fall Risk Assessment Details Patient Name: Date of Service: Richard [name] NA THA N 09/21/2020 1:15 PM Medical Record Number: [id number] Patient Account Number: [id number] Date of Birth/Sex: Treating RN: [birth year] (44 y.o. Male) Zandra Abts Primary Care Yojan Paskett: Marcy Siren Other Clinician: Referring Richard [name]: Treating Shawndrea Rutkowski/Extender: Otilio Connors in Treatment: 0 Fall Risk Assessment Items Have you had 2 or more falls in the last 12 monthso 0 Yes Have you had any fall that resulted in injury in the last 12 monthso 0 No FALLS RISK SCREEN History of falling - immediate or within 3 months 0 No Secondary diagnosis (Do you have 2 or more medical diagnoseso) 0 No Ambulatory aid None/bed rest/wheelchair/nurse 0 Yes Crutches/cane/walker 0 No Furniture 0 No Intravenous therapy Access/Saline/Heparin Lock 0 No Gait/Transferring Normal/ bed rest/  wheelchair 0 Yes Weak (short steps with or without shuffle, stooped but able to lift head while walking, may seek 0 No support from furniture) Impaired (short steps with shuffle, may have difficulty arising from chair, head down, impaired 0 No balance) Mental Status Oriented to own ability 0 Yes Electronic Signature(s) Signed: 09/21/2020 5:57:05 PM By: Zandra Abts RN, BSN Entered By: Zandra Abts on 09/21/2020 13:41:02 -------------------------------------------------------------------------------- Foot Assessment Details Patient Name: Date of Service: Richard [name] NA THA N 09/21/2020 1:15 PM Medical Record Number: [id number] Patient Account Number: [id number] Date of Birth/Sex: Treating RN: [birth year] [name] (44 y.o. Male) Zandra Abts Primary Care Amontae Ng: Marcy Siren Other Clinician: Referring Richard [name]: Treating Richard [name]/Extender: Otilio Connors in Treatment: 0 Foot Assessment Items Site Locations + = Sensation present, - = Sensation absent, C = Callus, U = Ulcer R = Redness, W = Warmth, M = Maceration, PU = Pre-ulcerative lesion F = Fissure, S = Swelling, D = Dryness Assessment Right: Left: Other Deformity: No No Prior Foot Ulcer: No No Prior Amputation: No No Charcot Joint: Yes  No Ambulatory Status: Non-ambulatory Assistance Device: Wheelchair Gait: Electronic Signature(s) Signed: 09/21/2020 5:57:05 PM By: Zandra Abts RN, BSN Entered By: Zandra Abts on 09/21/2020 13:41:57 -------------------------------------------------------------------------------- Nutrition Risk Screening Details Patient Name: Date of Service: Richard [name] NA THA N 09/21/2020 1:15 PM Medical Record Number: [id number] Patient Account Number: [id number] Date of Birth/Sex: Treating RN: Richard [name], Richard [name] (44 y.o. Male) Zandra Abts Primary Care Kymberly Blomberg: Marcy Siren Other Clinician: Referring Richard [name]: Treating Richard [name]/Extender: Otilio Connors in Treatment: 0 Height (in): 70 Weight (lbs): 134 Body Mass Index (BMI): 19.2 Nutrition Risk Screening Items Score Screening NUTRITION RISK SCREEN: I have an illness or condition that made me change the kind and/or amount of food I eat 0 No I eat fewer than two meals per day 0 No I eat few fruits and vegetables, or milk products 0 No I have three or more drinks of beer, liquor or wine almost every day 0 No I have tooth or mouth problems that make it hard for me to eat 0 No I don't always have enough money to buy the food I need 0 No I eat alone most of the time 0 No I take three or more different prescribed or over-the-counter drugs a day 0 No Without wanting to, I have lost or gained 10 pounds in the last six months 0 No I am not always physically able to shop, cook and/or feed myself 0 No Nutrition Protocols Good Risk Protocol 0 No interventions needed Moderate Risk Protocol High Risk Proctocol Risk Level: Good Risk Score: 0 Electronic Signature(s) Signed: 09/21/2020 5:57:05 PM By: Zandra Abts RN, BSN Entered By: Zandra Abts on 09/21/2020 13:41:10

## 2020-09-21 NOTE — Progress Notes (Signed)
[name], [name] ([id number]) Visit Report for 09/21/2020 Allergy List Details Patient Name: Date of Service: Richard [name] [place] THA [name] 09/21/2020 1:15 PM Medical Record Number: [id number] Patient Account Number: [id number] Date of Birth/Sex: Treating RN: March 29, 1976 (45 y.o. Male) Richard [name] Primary Care Richard [name]: Richard [name] Other Clinician: Referring Richard [name]: Treating Richard [name]/Extender: Richard [name] in Treatment: 0 Allergies Active Allergies Haldol Reaction: paralysis Allergy Notes Electronic Signature(s) Signed: 09/21/2020 5:57:05 PM By: Richard Abts RN, BSN Entered By: Richard [name] on 09/21/2020 13:38:14 -------------------------------------------------------------------------------- Arrival Information Details Patient Name: Date of Service: Richard [name] 09/21/2020 1:15 PM Medical Record Number: [id number] Patient Account Number: [id number] Date of Birth/Sex: Treating RN: [birth year] (44 y.o. Male) Richard [name] Primary Care Tearia Gibbs: Richard [name] Other Clinician: Referring Richard [name]: Treating Richard [name]/Extender: Richard [name] in Treatment: 0 Visit Information Patient Arrived: Wheel Chair Arrival Time: 13:28 Accompanied By: alone Transfer Assistance: None Patient Identification Verified: Yes Secondary Verification Process Completed: Yes Patient Requires Transmission-Based Precautions: No Patient Has Alerts: No Electronic Signature(s) Signed: 09/21/2020 5:57:05 PM By: Richard Abts RN, BSN Entered By: Richard [name] on 09/21/2020 13:30:30 -------------------------------------------------------------------------------- Clinic Level of Care Assessment Details Patient Name: Date of Service: Richard [name] 09/21/2020 1:15 PM Medical Record Number: [id number] Patient Account Number: [id number] Date of Birth/Sex: Treating RN: 04/10/1976 (44 y.o. Male) Richard [name] Primary Care Karthik Whittinghill: Richard [name] Other Clinician: Referring Richard [name]: Treating Richard [name]/Extender: Richard [name] in Treatment: 0 Clinic Level of Care Assessment Items TOOL 1 Quantity Score X- 1 0 Use when EandM and Procedure is performed on INITIAL visit ASSESSMENTS - Nursing Assessment / Reassessment X- 1 20 General Physical Exam (combine w/ comprehensive assessment (listed just below) when performed on new pt. evals) X- 1 25 Comprehensive Assessment (HX, ROS, Risk Assessments, Wounds Hx, etc.) ASSESSMENTS - Wound and Skin Assessment / Reassessment  - 0 Dermatologic / Skin Assessment (not related to wound area) ASSESSMENTS - Ostomy and/or Continence Assessment and Care  - 0 Incontinence Assessment and Management  - 0 Ostomy Care Assessment and Management (repouching, etc.) PROCESS - Coordination of Care X - Simple Patient / Family Education for ongoing care 1 15  - 0 Complex (extensive) Patient / Family Education for ongoing care X- 1 10 Staff obtains Chiropractor, Records, T Results / Process Orders est  - 0 Staff telephones HHA, Nursing Homes / Clarify orders / etc  - 0 Routine Transfer to another Facility (non-emergent condition)  - 0 Routine Hospital Admission (non-emergent condition) X- 1 15 New Admissions / Manufacturing engineer / Ordering NPWT Apligraf, etc. ,  - 0 Emergency Hospital Admission (emergent condition) PROCESS - Special Needs  - 0 Pediatric / Minor Patient Management  - 0 Isolation Patient Management  - 0 Hearing / Language / Visual special needs  - 0 Assessment of Community assistance (transportation, D/C planning, etc.)  - 0 Additional assistance / Altered mentation  - 0 Support Surface(s) Assessment (bed, cushion, seat, etc.) INTERVENTIONS - Miscellaneous  - 0 External ear exam  - 0 Patient Transfer (multiple staff / Nurse, adult / Similar devices)  - 0 Simple Staple / Suture removal (25 or less)   - 0 Complex Staple / Suture removal (26 or more)  - 0 Hypo/Hyperglycemic Management (do not check if billed separately) X- 1 15 Ankle / Brachial Index (ABI) - do not check if billed separately Has the patient been seen at the hospital within the last three years: Yes Total Score: 100  Level Of Care: New/Established - Level 3 Electronic Signature(s) Signed: 09/21/2020 5:57:05 PM By: Richard AbtsLynch, Shatara RN, BSN Entered By: Richard [name] on 09/21/2020 14:06:16 -------------------------------------------------------------------------------- Encounter Discharge Information Details Patient Name: Date of Service: Richard [name], Richard [name] 09/21/2020 1:15 PM Medical Record Number: [id number] Patient Account Number: [id number] Date of Birth/Sex: Treating RN: 1975-05-05 (44 y.o. Male) Richard [name] Primary Care Kyson Kupper: Richard [name] Other Clinician: Referring Kindal Ponti: Treating Keira Bohlin/Extender: Richard ConnorsHoffman, Richard Richard [name] [name] in Treatment: 0 Encounter Discharge Information Items Post Procedure Vitals Discharge Condition: Stable Temperature (F): 97.9 Ambulatory Status: Wheelchair Pulse (bpm): 67 Discharge Destination: Home Respiratory Rate (breaths/min): 18 Transportation: Private Auto Blood Pressure (mmHg): 104/69 Accompanied By: alone Schedule Follow-up Appointment: Yes Clinical Summary of Care: Patient Declined Electronic Signature(s) Signed: 09/21/2020 5:57:05 PM By: Richard AbtsLynch, Shatara RN, BSN Entered By: Richard [name] on 09/21/2020 16:33:54 -------------------------------------------------------------------------------- Lower Extremity Assessment Details Patient Name: Date of Service: Richard [name], Richard [name] 09/21/2020 1:15 PM Medical Record Number: [id number] Patient Account Number: [id number] Date of Birth/Sex: Treating RN: 1975-05-05 (44 y.o. Male) Richard [name] Primary Care Richard [name]: Richard [name] Other Clinician: Referring Naveen Lorusso: Treating Haelyn Forgey/Extender:  Richard ConnorsHoffman, Richard Richard [name] [name] in Treatment: 0 Edema Assessment Assessed: [Left: No] [Right: No] Edema: [Left: Yes] [Right: Yes] Vascular Assessment Blood Pressure: Brachial: [Left:104] [Right:104] Ankle: [Left:Dorsalis Pedis: 110 1.06] [Right:Dorsalis Pedis: 110 1.06] Electronic Signature(s) Signed: 09/21/2020 5:57:05 PM By: Richard AbtsLynch, Shatara RN, BSN Entered By: Richard [name] on 09/21/2020 13:47:17 -------------------------------------------------------------------------------- Multi Wound Chart Details Patient Name: Date of Service: Richard [name], Richard [name] 09/21/2020 1:15 PM Medical Record Number: [id number] Patient Account Number: [id number] Date of Birth/Sex: Treating RN: 1975-05-05 (44 y.o. Male) Zenaida DeedBoehlein, Linda Primary Care Brinlyn Cena: Other Clinician: Marcy SirenWallace, Richard Referring Ketra Duchesne: Treating Tyshika Baldridge/Extender: Richard ConnorsHoffman, Richard Richard [name] [name] in Treatment: 0 Vital Signs Height(in): 70 Pulse(bpm): 67 Weight(lbs): 134 Blood Pressure(mmHg): 104/69 Body Mass Index(BMI): 19 Temperature(F): 97.9 Respiratory Rate(breaths/min): 18 Photos: [1:No Photos Left, Plantar Calcaneus] [2:No Photos Right Calcaneus] [[name]/A:[name]/A [name]/A] Wound Location: [1:Frostbite] [2:Frostbite] [[name]/A:[name]/A] Wounding Event: [1:Pressure Ulcer] [2:Pressure Ulcer] [[name]/A:[name]/A] Primary Etiology: [1:07/15/2020] [2:07/15/2020] [[name]/A:[name]/A] Date Acquired: [1:0] [2:0] [[name]/A:[name]/A] [name] of Treatment: [1:Open] [2:Open] [[name]/A:[name]/A] Wound Status: [1:0.6x1.8x0.2] [2:0.5x1.5x0.2] [[name]/A:[name]/A] Measurements L x W x D (cm) [1:0.848] [2:0.589] [[name]/A:[name]/A] A (cm) : rea [1:0.17] [2:0.118] [[name]/A:[name]/A] Volume (cm) : [1:Category/Stage III] [2:Category/Stage III] [[name]/A:[name]/A] Classification: [1:Medium] [2:Medium] [[name]/A:[name]/A] Exudate A mount: [1:Serosanguineous] [2:Serosanguineous] [[name]/A:[name]/A] Exudate Type: [1:red, brown] [2:red, brown] [[name]/A:[name]/A] Exudate Color: [1:Flat and Intact] [2:Flat and Intact] [[name]/A:[name]/A] Wound Margin: [1:Medium  (34-66%)] [2:Medium (34-66%)] [[name]/A:[name]/A] Granulation A mount: [1:Pink] [2:Pink] [[name]/A:[name]/A] Granulation Quality: [1:Medium (34-66%)] [2:Medium (34-66%)] [[name]/A:[name]/A] Necrotic A mount: [1:Fat Layer (Subcutaneous Tissue): Yes Fat Layer (Subcutaneous Tissue): Yes [name]/A] Exposed Structures: [1:Fascia: No Tendon: No Muscle: No Joint: No Bone: No None] [2:Fascia: No Tendon: No Muscle: No Joint: No Bone: No None] [[name]/A:[name]/A] Epithelialization: [1:Debridement - Excisional] [2:Debridement - Excisional] [[name]/A:[name]/A] Debridement: Pre-procedure Verification/Time Out 14:04 [2:14:04] [[name]/A:[name]/A] Taken: [1:Other] [2:Other] [[name]/A:[name]/A] Pain Control: [1:Callus, Subcutaneous, Slough] [2:Callus, Subcutaneous, Slough] [[name]/A:[name]/A] Tissue Debrided: [1:Skin/Subcutaneous Tissue] [2:Skin/Subcutaneous Tissue] [[name]/A:[name]/A] Level: [1:1.08] [2:0.75] [[name]/A:[name]/A] Debridement A (sq cm): [1:rea Curette] [2:Curette] [[name]/A:[name]/A] Instrument: [1:Minimum] [2:Minimum] [[name]/A:[name]/A] Bleeding: [1:Pressure] [2:Pressure] [[name]/A:[name]/A] Hemostasis A chieved: [1:0] [2:0] [[name]/A:[name]/A] Procedural Pain: [1:0] [2:0] [[name]/A:[name]/A] Post Procedural Pain: [1:Procedure was tolerated well] [2:Procedure was tolerated well] [[name]/A:[name]/A] Debridement Treatment Response: [1:0.6x1.8x0.2] [2:0.5x1.5x0.2] [[name]/A:[name]/A] Post Debridement Measurements L x W x D (cm) [1:0.17] [2:0.118] [[name]/A:[name]/A] Post Debridement Volume: (cm) [1:Category/Stage III] [2:Category/Stage III] [[name]/A:[name]/A] Post Debridement Stage: [1:Debridement] [2:Debridement] [[name]/A:[name]/A] Treatment Notes Electronic Signature(s) Signed: 09/21/2020  2:37:50 PM By: Geralyn Corwin DO Signed: 09/21/2020 6:12:56 PM By: [name] RN, BSN Entered By: Geralyn Corwin on 09/21/2020 14:28:32 -------------------------------------------------------------------------------- Multi-Disciplinary Care Plan Details Patient Name: Date of Service: Richard [name] 09/21/2020 1:15 PM Medical Record Number: [id number] Patient Account Number: [id number] Date  of Birth/Sex: Treating RN: [birth year] (45 y.o. Male) Richard [name] Primary Care Krystyn Picking: Richard [name] Other Clinician: Referring Danzig Macgregor: Treating Howie Rufus/Extender: Richard [name] in Treatment: 0 Multidisciplinary Care Plan reviewed with physician Active Inactive Pressure Nursing Diagnoses: Knowledge deficit related to causes and risk factors for pressure ulcer development Knowledge deficit related to management of pressures ulcers Potential for impaired tissue integrity related to pressure, friction, moisture, and shear Goals: Patient/caregiver will verbalize risk factors for pressure ulcer development Date Initiated: 09/21/2020 Target Resolution Date: 10/21/2020 Goal Status: Active Patient/caregiver will verbalize understanding of pressure ulcer management Date Initiated: 09/21/2020 Target Resolution Date: 10/21/2020 Goal Status: Active Interventions: Assess: immobility, friction, shearing, incontinence upon admission and as needed Assess offloading mechanisms upon admission and as needed Assess potential for pressure ulcer upon admission and as needed Provide education on pressure ulcers Notes: Wound/Skin Impairment Nursing Diagnoses: Impaired tissue integrity Knowledge deficit related to smoking impact on wound healing Knowledge deficit related to ulceration/compromised skin integrity Goals: Patient/caregiver will verbalize understanding of skin care regimen Date Initiated: 09/21/2020 Target Resolution Date: 10/21/2020 Goal Status: Active Ulcer/skin breakdown will have a volume reduction of 30% by week 4 Date Initiated: 09/21/2020 Target Resolution Date: 10/21/2020 Goal Status: Active Interventions: Assess patient/caregiver ability to obtain necessary supplies Assess patient/caregiver ability to perform ulcer/skin care regimen upon admission and as needed Assess ulceration(s) every visit Provide education on smoking Provide education on ulcer  and skin care Notes: Electronic Signature(s) Signed: 09/21/2020 5:57:05 PM By: Richard Abts RN, BSN Entered By: Richard [name] on 09/21/2020 14:04:34 -------------------------------------------------------------------------------- Pain Assessment Details Patient Name: Date of Service: Richard [name] 09/21/2020 1:15 PM Medical Record Number: [id number] Patient Account Number: [id number] Date of Birth/Sex: Treating RN: [birth year] (45 y.o. Male) Richard [name] Primary Care Wasif Simonich: Richard [name] Other Clinician: Referring Zadiel Leyh: Treating Adler Alton/Extender: Richard [name] in Treatment: 0 Active Problems Location of Pain Severity and Description of Pain Patient Has Paino No Site Locations Pain Management and Medication Current Pain Management: Electronic Signature(s) Signed: 09/21/2020 5:57:05 PM By: Richard Abts RN, BSN Entered By: Richard [name] on 09/21/2020 13:49:54 -------------------------------------------------------------------------------- Patient/Caregiver Education Details Patient Name: Date of Service: Richard [name] 6/8/2022andnbsp1:15 PM Medical Record Number: [id number] Patient Account Number: [id number] Date of Birth/Gender: Treating RN: 1975/05/22 (44 y.o. Male) Richard [name] Primary Care Physician: Richard [name] Other Clinician: Referring Physician: Treating Physician/Extender: Richard [name] in Treatment: 0 Education Assessment Education Provided To: Patient Education Topics Provided Pressure: Methods: Explain/Verbal Responses: State content correctly Smoking and Wound Healing: Methods: Explain/Verbal Responses: State content correctly Wound/Skin Impairment: Methods: Explain/Verbal Responses: State content correctly Electronic Signature(s) Signed: 09/21/2020 5:57:05 PM By: Richard Abts RN, BSN Entered By: Richard [name] on 09/21/2020  14:05:53 -------------------------------------------------------------------------------- Wound Assessment Details Patient Name: Date of Service: Richard [name] 09/21/2020 1:15 PM Medical Record Number: [id number] Patient Account Number: [id number] Date of Birth/Sex: Treating RN: Jul 21, 1975 (44 y.o. Male) Richard [name] Primary Care Leron Stoffers: Richard [name] Other Clinician: Referring Nakiyah Beverley: Treating Elainah Rhyne/Extender: Richard [name] in Treatment: 0 Wound Status Wound Number: 1 Primary Etiology: Pressure Ulcer Wound Location: Left, Plantar Calcaneus Wound Status: Open Wounding Event: Frostbite Date Acquired: 07/15/2020  [name] Of Treatment: 0 Clustered Wound: No Photos Wound Measurements Length: (cm) 0.6 Width: (cm) 1.8 Depth: (cm) 0.2 Area: (cm) 0.848 Volume: (cm) 0.17 % Reduction in Area: 0% % Reduction in Volume: 0% Epithelialization: None Tunneling: No Undermining: No Wound Description Classification: Category/Stage III Wound Margin: Flat and Intact Exudate Amount: Medium Exudate Type: Serosanguineous Exudate Color: red, brown Foul Odor After Cleansing: No Slough/Fibrino Yes Wound Bed Granulation Amount: Medium (34-66%) Exposed Structure Granulation Quality: Pink Fascia Exposed: No Necrotic Amount: Medium (34-66%) Fat Layer (Subcutaneous Tissue) Exposed: Yes Necrotic Quality: Adherent Slough Tendon Exposed: No Muscle Exposed: No Joint Exposed: No Bone Exposed: No Treatment Notes Wound #1 (Calcaneus) Wound Laterality: Plantar, Left Cleanser Normal Saline Discharge Instruction: Cleanse the wound with Normal Saline prior to applying a clean dressing using gauze sponges, not tissue or cotton balls. Peri-Wound Care Topical Primary Dressing Hydrofera Blue Ready Foam, 2.5 x2.5 in Discharge Instruction: Apply to wound bed as instructed Secondary Dressing Woven Gauze Sponges 2x2 in Discharge Instruction: Apply over primary  dressing as directed. Zetuvit Plus Silicone Border Dressing 4x4 (in/in) Discharge Instruction: Apply silicone border over primary dressing as directed. Secured With Compression Wrap Compression Stockings Facilities manager) Signed: 09/21/2020 5:19:11 PM By: Karl Ito Signed: 09/21/2020 5:57:05 PM By: Richard Abts RN, BSN Entered By: Karl Ito on 09/21/2020 16:51:09 -------------------------------------------------------------------------------- Wound Assessment Details Patient Name: Date of Service: Richard [name] 09/21/2020 1:15 PM Medical Record Number: [id number] Patient Account Number: [id number] Date of Birth/Sex: Treating RN: Apr 06, 1976 (44 y.o. Male) Richard [name] Primary Care Eliazer Hemphill: Richard [name] Other Clinician: Referring Tatayana Beshears: Treating Evaan Tidwell/Extender: Richard [name] in Treatment: 0 Wound Status Wound Number: 2 Primary Etiology: Pressure Ulcer Wound Location: Right Calcaneus Wound Status: Open Wounding Event: Frostbite Date Acquired: 07/15/2020 [name] Of Treatment: 0 Clustered Wound: No Photos Wound Measurements Length: (cm) 0.5 Width: (cm) 1.5 Depth: (cm) 0.2 Area: (cm) 0.589 Volume: (cm) 0.118 % Reduction in Area: 0% % Reduction in Volume: 0% Epithelialization: None Tunneling: No Undermining: No Wound Description Classification: Category/Stage III Wound Margin: Flat and Intact Exudate Amount: Medium Exudate Type: Serosanguineous Exudate Color: red, brown Wound Bed Granulation Amount: Medium (34-66%) Granulation Quality: Pink Necrotic Amount: Medium (34-66%) Necrotic Quality: Adherent Slough Foul Odor After Cleansing: No Slough/Fibrino Yes Exposed Structure Fascia Exposed: No Fat Layer (Subcutaneous Tissue) Exposed: Yes Tendon Exposed: No Muscle Exposed: No Joint Exposed: No Bone Exposed: No Treatment Notes Wound #2 (Calcaneus) Wound Laterality: Right Cleanser Normal  Saline Discharge Instruction: Cleanse the wound with Normal Saline prior to applying a clean dressing using gauze sponges, not tissue or cotton balls. Peri-Wound Care Topical Primary Dressing Hydrofera Blue Ready Foam, 2.5 x2.5 in Discharge Instruction: Apply to wound bed as instructed Secondary Dressing Woven Gauze Sponges 2x2 in Discharge Instruction: Apply over primary dressing as directed. Zetuvit Plus Silicone Border Dressing 4x4 (in/in) Discharge Instruction: Apply silicone border over primary dressing as directed. Secured With Compression Wrap Compression Stockings Facilities manager) Signed: 09/21/2020 5:19:11 PM By: Karl Ito Signed: 09/21/2020 5:57:05 PM By: Richard Abts RN, BSN Entered By: Karl Ito on 09/21/2020 16:50:48 -------------------------------------------------------------------------------- Vitals Details Patient Name: Date of Service: Richard [name] 09/21/2020 1:15 PM Medical Record Number: [id number] Patient Account Number: [id number] Date of Birth/Sex: Treating RN: 06/03/1975 (44 y.o. Male) Richard [name] Primary Care Edgar Reisz: Richard [name] Other Clinician: Referring Yoana Staib: Treating Kenika Sahm/Extender: Richard [name] in Treatment: 0 Vital Signs Time Taken: 13:28 Temperature (F): 97.9 Height (in): 70 Pulse (bpm): 67  Source: Stated Respiratory Rate (breaths/min): 18 Weight (lbs): 134 Blood Pressure (mmHg): 104/69 Source: Stated Reference Range: 80 - 120 mg / dl Body Mass Index (BMI): 19.2 Electronic Signature(s) Signed: 09/21/2020 5:57:05 PM By: Richard Abts RN, BSN Entered By: Richard [name] on 09/21/2020 13:32:32

## 2020-09-21 NOTE — Telephone Encounter (Signed)
Appears has appointment scheduled 6/17 with Amy Zonia Kief to evaluate for need for powerchair. Let me know if I can help any other way.   Marcy Siren, D.O. [facility] at [facility]  09/21/2020, 12:25 PM

## 2020-09-21 NOTE — Progress Notes (Signed)
SEIBERT, KEETER ([id number]) Visit Report for 09/21/2020 Chief Complaint Document Details Patient Name: Date of Service: Richard [name] NA THA N 09/21/2020 1:15 PM Medical Record Number: [id number] Patient Account Number: [id number] Date of Birth/Sex: Treating RN: 07/16/1975 (45 y.o. Male) [name] Primary Care Provider: Marcy Siren Other Clinician: Referring Provider: Treating Provider/Extender: Otilio Connors in Treatment: 0 Information Obtained from: Patient Chief Complaint Bilateral heel wounds Electronic Signature(s) Signed: 09/21/2020 2:37:50 PM By: Geralyn Corwin DO Entered By: Geralyn Corwin on 09/21/2020 14:28:54 -------------------------------------------------------------------------------- Debridement Details Patient Name: Date of Service: Richard [name] NA THA N 09/21/2020 1:15 PM Medical Record Number: [id number] Patient Account Number: [id number] Date of Birth/Sex: Treating RN: March 11, 1976 (44 y.o. Male) Richard [name] Primary Care Provider: Marcy Siren Other Clinician: Referring Provider: Treating Provider/Extender: Otilio Connors in Treatment: 0 Debridement Performed for Assessment: Wound #1 Left,Plantar Calcaneus Performed By: Physician Geralyn Corwin, DO Debridement Type: Debridement Level of Consciousness (Pre-procedure): Awake and Alert Pre-procedure Verification/Time Out Yes - 14:04 Taken: Start Time: 14:04 Pain Control: Other : Benzocaine 20% T Area Debrided (L x W): otal 0.6 (cm) x 1.8 (cm) = 1.08 (cm) Tissue and other material debrided: Viable, Non-Viable, Callus, Slough, Subcutaneous, Slough Level: Skin/Subcutaneous Tissue Debridement Description: Excisional Instrument: Curette Bleeding: Minimum Hemostasis Achieved: Pressure End Time: 14:05 Procedural Pain: 0 Post Procedural Pain: 0 Response to Treatment: Procedure was tolerated well Level of Consciousness (Post- Awake and  Alert procedure): Post Debridement Measurements of Total Wound Length: (cm) 0.6 Stage: Category/Stage III Width: (cm) 1.8 Depth: (cm) 0.2 Volume: (cm) 0.17 Character of Wound/Ulcer Post Debridement: Improved Post Procedure Diagnosis Same as Pre-procedure Electronic Signature(s) Signed: 09/21/2020 2:37:50 PM By: Geralyn Corwin DO Signed: 09/21/2020 5:57:05 PM By: Richard Abts RN, BSN Entered By: Richard [name] on 09/21/2020 14:05:34 -------------------------------------------------------------------------------- Debridement Details Patient Name: Date of Service: Richard [name] NA THA N 09/21/2020 1:15 PM Medical Record Number: [id number] Patient Account Number: [id number] Date of Birth/Sex: Treating RN: 1976/04/05 (45 y.o. Male) Richard [name] Primary Care Provider: Marcy Siren Other Clinician: Referring Provider: Treating Provider/Extender: Otilio Connors in Treatment: 0 Debridement Performed for Assessment: Wound #2 Right Calcaneus Performed By: Physician Geralyn Corwin, DO Debridement Type: Debridement Level of Consciousness (Pre-procedure): Awake and Alert Pre-procedure Verification/Time Out Yes - 14:04 Taken: Start Time: 14:04 Pain Control: Other : Benzocaine 20% T Area Debrided (L x W): otal 0.5 (cm) x 1.5 (cm) = 0.75 (cm) Tissue and other material debrided: Viable, Non-Viable, Callus, Slough, Subcutaneous, Slough Level: Skin/Subcutaneous Tissue Debridement Description: Excisional Instrument: Curette Bleeding: Minimum Hemostasis Achieved: Pressure End Time: 14:05 Procedural Pain: 0 Post Procedural Pain: 0 Response to Treatment: Procedure was tolerated well Level of Consciousness (Post- Awake and Alert procedure): Post Debridement Measurements of Total Wound Length: (cm) 0.5 Stage: Category/Stage III Width: (cm) 1.5 Depth: (cm) 0.2 Volume: (cm) 0.118 Character of Wound/Ulcer Post Debridement: Improved Post Procedure  Diagnosis Same as Pre-procedure Electronic Signature(s) Signed: 09/21/2020 2:37:50 PM By: Geralyn Corwin DO Signed: 09/21/2020 5:57:05 PM By: Richard Abts RN, BSN Entered By: Richard [name] on 09/21/2020 14:07:00 -------------------------------------------------------------------------------- HPI Details Patient Name: Date of Service: Richard [name] NA THA N 09/21/2020 1:15 PM Medical Record Number: [id number] Patient Account Number: [id number] Date of Birth/Sex: Treating RN: 1976-03-13 (44 y.o. Male) [name] Primary Care Provider: Marcy Siren Other Clinician: Referring Provider: Treating Provider/Extender: Otilio Connors in Treatment: 0 History of Present Illness HPI Description: Admission 6/8 Mr. Richard [name] is a 45 year old male with a past  medical history of schizoaffective disorder that presents to our clinic for bilateral heel wounds. He states that while he resided in [place] in March 2022 he developed frostbite and had bilateral transmetatarsal amputations. He also developed heel wounds Post surgery. He has since traveled to [place] and resides with his father. He was seen by Dr. Allyne Gee, orthopedic surgery at [facility] and was referred to our clinic for wound care. He has currently been putting Medihoney on the wound and doing aggressive offloading. He reports improvement in wound healing over the past several months. He denies signs of infection. Electronic Signature(s) Signed: 09/21/2020 2:37:50 PM By: Geralyn Corwin DO Entered By: Geralyn Corwin on 09/21/2020 14:33:52 -------------------------------------------------------------------------------- Physical Exam Details Patient Name: Date of Service: Richard [name] NA THA N 09/21/2020 1:15 PM Medical Record Number: [id number] Patient Account Number: [id number] Date of Birth/Sex: Treating RN: 1975-05-10 (44 y.o. Male) [name] Primary Care Provider: Marcy Siren Other  Clinician: Referring Provider: Treating Provider/Extender: Otilio Connors in Treatment: 0 Constitutional respirations regular, non-labored and within target range for patient.. Cardiovascular 2+ dorsalis pedis/posterior tibialis pulses. Psychiatric pleasant and cooperative. Notes Bilateral heel wounds with granulation tissue and nonviable tissue present. No signs of infection. Electronic Signature(s) Signed: 09/21/2020 2:37:50 PM By: Geralyn Corwin DO Entered By: Geralyn Corwin on 09/21/2020 14:34:29 -------------------------------------------------------------------------------- Physician Orders Details Patient Name: Date of Service: Richard [name] NA THA N 09/21/2020 1:15 PM Medical Record Number: [id number] Patient Account Number: [id number] Date of Birth/Sex: Treating RN: 11-Sep-1975 (44 y.o. Male) Richard [name] Primary Care Provider: Marcy Siren Other Clinician: Referring Provider: Treating Provider/Extender: Otilio Connors in Treatment: 0 Verbal / Phone Orders: No Diagnosis Coding ICD-10 Coding Code Description L89.619 Pressure ulcer of right heel, unspecified stage L89.629 Pressure ulcer of left heel, unspecified stage F25.8 Other schizoaffective disorders Follow-up Appointments ppointment in 2 weeks. - with Dr. Mikey Bussing Return A Bathing/ Shower/ Hygiene May shower and wash wound with soap and water. Off-Loading Turn and reposition every 2 hours Other: - float heels off of bed/chair with pillow under calves Wound Treatment Wound #1 - Calcaneus Wound Laterality: Plantar, Left Cleanser: Normal Saline (DME) (Generic) 1 x Per Day/30 Days Discharge Instructions: Cleanse the wound with Normal Saline prior to applying a clean dressing using gauze sponges, not tissue or cotton balls. Prim Dressing: Hydrofera Blue Ready Foam, 2.5 x2.5 in 1 x Per Day/30 Days ary Discharge Instructions: Apply to wound bed as  instructed Secondary Dressing: Woven Gauze Sponges 2x2 in (DME) (Generic) 1 x Per Day/30 Days Discharge Instructions: Apply over primary dressing as directed. Secondary Dressing: Zetuvit Plus Silicone Border Dressing 4x4 (in/in) (DME) (Generic) 1 x Per Day/30 Days Discharge Instructions: Apply silicone border over primary dressing as directed. Wound #2 - Calcaneus Wound Laterality: Right Cleanser: Normal Saline (DME) (Generic) 1 x Per Day/30 Days Discharge Instructions: Cleanse the wound with Normal Saline prior to applying a clean dressing using gauze sponges, not tissue or cotton balls. Prim Dressing: Hydrofera Blue Ready Foam, 2.5 x2.5 in 1 x Per Day/30 Days ary Discharge Instructions: Apply to wound bed as instructed Secondary Dressing: Woven Gauze Sponges 2x2 in (DME) (Generic) 1 x Per Day/30 Days Discharge Instructions: Apply over primary dressing as directed. Secondary Dressing: Zetuvit Plus Silicone Border Dressing 4x4 (in/in) (DME) (Generic) 1 x Per Day/30 Days Discharge Instructions: Apply silicone border over primary dressing as directed. Electronic Signature(s) Signed: 09/21/2020 5:13:04 PM By: Geralyn Corwin DO Signed: 09/21/2020 5:57:05 PM By: Richard Abts RN, BSN Previous Signature:  09/21/2020 2:37:50 PM Version By: Geralyn Corwin DO Entered By: Richard [name] on 09/21/2020 16:32:43 -------------------------------------------------------------------------------- Problem List Details Patient Name: Date of Service: Richard [name] NA THA N 09/21/2020 1:15 PM Medical Record Number: [id number] Patient Account Number: [id number] Date of Birth/Sex: Treating RN: 1975-09-15 (44 y.o. Male) [name] Primary Care Provider: Marcy Siren Other Clinician: Referring Provider: Treating Provider/Extender: Otilio Connors in Treatment: 0 Active Problems ICD-10 Encounter Code Description Active Date MDM Diagnosis L89.619 Pressure ulcer of right heel,  unspecified stage 09/21/2020 No Yes L89.629 Pressure ulcer of left heel, unspecified stage 09/21/2020 No Yes F25.8 Other schizoaffective disorders 09/21/2020 No Yes Inactive Problems Resolved Problems Electronic Signature(s) Signed: 09/21/2020 2:37:50 PM By: Geralyn Corwin DO Entered By: Geralyn Corwin on 09/21/2020 14:28:26 -------------------------------------------------------------------------------- Progress Note Details Patient Name: Date of Service: Richard [name] NA THA N 09/21/2020 1:15 PM Medical Record Number: [id number] Patient Account Number: [id number] Date of Birth/Sex: Treating RN: 20-Oct-1975 (44 y.o. Male) [name] Primary Care Provider: Marcy Siren Other Clinician: Referring Provider: Treating Provider/Extender: Otilio Connors in Treatment: 0 Subjective Chief Complaint Information obtained from Patient Bilateral heel wounds History of Present Illness (HPI) Admission 6/8 Mr. Selim Durden is a 45 year old male with a past medical history of schizoaffective disorder that presents to our clinic for bilateral heel wounds. He states that while he resided in [place] in March 2022 he developed frostbite and had bilateral transmetatarsal amputations. He also developed heel wounds Post surgery. He has since traveled to [place] and resides with his father. He was seen by Dr. Allyne Gee, orthopedic surgery at [facility] and was referred to our clinic for wound care. He has currently been putting Medihoney on the wound and doing aggressive offloading. He reports improvement in wound healing over the past several months. He denies signs of infection. Patient History Information obtained from Patient. Allergies Haldol (Reaction: paralysis) Family History Unknown History. Social History Current every day smoker - 1 pack per day, Marital Status - Divorced, Alcohol Use - Rarely, Drug Use - No History, Caffeine Use - Daily. Medical A Surgical  History Notes nd Psychiatric Bipolar, Schizoaffective disorder Review of Systems (ROS) Constitutional Symptoms (General Health) Denies complaints or symptoms of Fatigue, Fever, Chills, Marked Weight Change. Eyes Denies complaints or symptoms of Dry Eyes, Vision Changes, Glasses / Contacts. Ear/Nose/Mouth/Throat Denies complaints or symptoms of Chronic sinus problems or rhinitis. Respiratory Denies complaints or symptoms of Chronic or frequent coughs, Shortness of Breath. Cardiovascular Denies complaints or symptoms of Chest pain. Gastrointestinal Denies complaints or symptoms of Frequent diarrhea, Nausea, Vomiting. Endocrine Denies complaints or symptoms of Heat/cold intolerance. Genitourinary Denies complaints or symptoms of Frequent urination. Integumentary (Skin) Complains or has symptoms of Wounds - bilateral heel wounds. Musculoskeletal Denies complaints or symptoms of Muscle Pain, Muscle Weakness. Neurologic Denies complaints or symptoms of Numbness/parasthesias. Objective Constitutional respirations regular, non-labored and within target range for patient.. Vitals Time Taken: 1:28 PM, Height: 70 in, Source: Stated, Weight: 134 lbs, Source: Stated, BMI: 19.2, Temperature: 97.9 F, Pulse: 67 bpm, Respiratory Rate: 18 breaths/min, Blood Pressure: 104/69 mmHg. Cardiovascular 2+ dorsalis pedis/posterior tibialis pulses. Psychiatric pleasant and cooperative. General Notes: Bilateral heel wounds with granulation tissue and nonviable tissue present. No signs of infection. Integumentary (Hair, Skin) Wound #1 status is Open. Original cause of wound was Frostbite. The date acquired was: 07/15/2020. The wound is located on the Left,Plantar Calcaneus. The wound measures 0.6cm length x 1.8cm width x 0.2cm depth; 0.848cm^2 area and 0.17cm^3 volume. There is  Fat Layer (Subcutaneous Tissue) exposed. There is no tunneling or undermining noted. There is a medium amount of serosanguineous  drainage noted. The wound margin is flat and intact. There is medium (34- 66%) pink granulation within the wound bed. There is a medium (34-66%) amount of necrotic tissue within the wound bed including Adherent Slough. Wound #2 status is Open. Original cause of wound was Frostbite. The date acquired was: 07/15/2020. The wound is located on the Right Calcaneus. The wound measures 0.5cm length x 1.5cm width x 0.2cm depth; 0.589cm^2 area and 0.118cm^3 volume. There is Fat Layer (Subcutaneous Tissue) exposed. There is no tunneling or undermining noted. There is a medium amount of serosanguineous drainage noted. The wound margin is flat and intact. There is medium (34-66%) pink granulation within the wound bed. There is a medium (34-66%) amount of necrotic tissue within the wound bed including Adherent Slough. Assessment Active Problems ICD-10 Pressure ulcer of right heel, unspecified stage Pressure ulcer of left heel, unspecified stage Other schizoaffective disorders Patient's wounds appear well-healing. These appear to be pressure ulcers. I debrided the nonviable tissue. I recommended he use Hydrofera Blue daily to the wound beds. There are no signs of infection. He is using a wheelchair currently so not to put any pressure on the heels. He is otherwise mobile. He can follow-up in 2 weeks Procedures Wound #1 Pre-procedure diagnosis of Wound #1 is a Pressure Ulcer located on the Left,Plantar Calcaneus . There was a Excisional Skin/Subcutaneous Tissue Debridement with a total area of 1.08 sq cm performed by Geralyn Corwin, DO. With the following instrument(s): Curette to remove Viable and Non-Viable tissue/material. Material removed includes Callus, Subcutaneous Tissue, and Slough after achieving pain control using Other (Benzocaine 20%). No specimens were taken. A time out was conducted at 14:04, prior to the start of the procedure. A Minimum amount of bleeding was controlled with Pressure. The  procedure was tolerated well with a pain level of 0 throughout and a pain level of 0 following the procedure. Post Debridement Measurements: 0.6cm length x 1.8cm width x 0.2cm depth; 0.17cm^3 volume. Post debridement Stage noted as Category/Stage III. Character of Wound/Ulcer Post Debridement is improved. Post procedure Diagnosis Wound #1: Same as Pre-Procedure Wound #2 Pre-procedure diagnosis of Wound #2 is a Pressure Ulcer located on the Right Calcaneus . There was a Excisional Skin/Subcutaneous Tissue Debridement with a total area of 0.75 sq cm performed by Geralyn Corwin, DO. With the following instrument(s): Curette to remove Viable and Non-Viable tissue/material. Material removed includes Callus, Subcutaneous Tissue, and Slough after achieving pain control using Other (Benzocaine 20%). No specimens were taken. A time out was conducted at 14:04, prior to the start of the procedure. A Minimum amount of bleeding was controlled with Pressure. The procedure was tolerated well with a pain level of 0 throughout and a pain level of 0 following the procedure. Post Debridement Measurements: 0.5cm length x 1.5cm width x 0.2cm depth; 0.118cm^3 volume. Post debridement Stage noted as Category/Stage III. Character of Wound/Ulcer Post Debridement is improved. Post procedure Diagnosis Wound #2: Same as Pre-Procedure Plan Follow-up Appointments: Return Appointment in 2 weeks. - with Dr. Mikey Bussing Bathing/ Shower/ Hygiene: May shower and wash wound with soap and water. Off-Loading: Turn and reposition every 2 hours Other: - float heels off of bed/chair with pillow under calves WOUND #1: - Calcaneus Wound Laterality: Plantar, Left Cleanser: Normal Saline (DME) (Generic) 1 x Per Day/30 Days Discharge Instructions: Cleanse the wound with Normal Saline prior to applying a clean dressing  using gauze sponges, not tissue or cotton balls. Secondary Dressing: Woven Gauze Sponges 2x2 in (DME) (Generic) 1 x Per  Day/30 Days Discharge Instructions: Apply over primary dressing as directed. Secondary Dressing: Zetuvit Plus Silicone Border Dressing 4x4 (in/in) (DME) (Generic) 1 x Per Day/30 Days Discharge Instructions: Apply silicone border over primary dressing as directed. WOUND #2: - Calcaneus Wound Laterality: Right Cleanser: Normal Saline (DME) (Generic) 1 x Per Day/30 Days Discharge Instructions: Cleanse the wound with Normal Saline prior to applying a clean dressing using gauze sponges, not tissue or cotton balls. Secondary Dressing: Woven Gauze Sponges 2x2 in (DME) (Generic) 1 x Per Day/30 Days Discharge Instructions: Apply over primary dressing as directed. Secondary Dressing: Zetuvit Plus Silicone Border Dressing 4x4 (in/in) (DME) (Generic) 1 x Per Day/30 Days Discharge Instructions: Apply silicone border over primary dressing as directed. 1. Hydrofera Blue daily 2. Follow-up in 2 weeks Electronic Signature(s) Signed: 09/21/2020 2:37:50 PM By: Geralyn CorwinHoffman, Hadyn Blanck DO Entered By: Geralyn CorwinHoffman, Ninnie Fein on 09/21/2020 14:37:03 -------------------------------------------------------------------------------- HxROS Details Patient Name: Date of Service: Richard ChesterBO WE, JO NA THA N 09/21/2020 1:15 PM Medical Record Number: [id number] Patient Account Number: [id number] Date of Birth/Sex: Treating RN: 21-Feb-1976 (44 y.o. Male) Richard AbtsLynch, Shatara Primary Care Provider: Marcy SirenWallace, Catherine Other Clinician: Referring Provider: Treating Provider/Extender: Otilio ConnorsHoffman, Dayna Geurts Wallace, Catherine Weeks in Treatment: 0 Information Obtained From Patient Constitutional Symptoms (General Health) Complaints and Symptoms: Negative for: Fatigue; Fever; Chills; Marked Weight Change Eyes Complaints and Symptoms: Negative for: Dry Eyes; Vision Changes; Glasses / Contacts Ear/Nose/Mouth/Throat Complaints and Symptoms: Negative for: Chronic sinus problems or rhinitis Respiratory Complaints and Symptoms: Negative for: Chronic or  frequent coughs; Shortness of Breath Cardiovascular Complaints and Symptoms: Negative for: Chest pain Gastrointestinal Complaints and Symptoms: Negative for: Frequent diarrhea; Nausea; Vomiting Endocrine Complaints and Symptoms: Negative for: Heat/cold intolerance Genitourinary Complaints and Symptoms: Negative for: Frequent urination Integumentary (Skin) Complaints and Symptoms: Positive for: Wounds - bilateral heel wounds Musculoskeletal Complaints and Symptoms: Negative for: Muscle Pain; Muscle Weakness Neurologic Complaints and Symptoms: Negative for: Numbness/parasthesias Hematologic/Lymphatic Immunological Oncologic Psychiatric Medical History: Past Medical History Notes: Bipolar, Schizoaffective disorder Immunizations Pneumococcal Vaccine: Received Pneumococcal Vaccination: No Implantable Devices None Family and Social History Unknown History: Yes; Current every day smoker - 1 pack per day; Marital Status - Divorced; Alcohol Use: Rarely; Drug Use: No History; Caffeine Use: Daily; Financial Concerns: No; Food, Clothing or Shelter Needs: No; Support System Lacking: No; Transportation Concerns: No Electronic Signature(s) Signed: 09/21/2020 2:37:50 PM By: Geralyn CorwinHoffman, Eleanora Guinyard DO Signed: 09/21/2020 5:57:05 PM By: Richard AbtsLynch, Shatara RN, BSN Entered By: Richard AbtsLynch, Shatara on 09/21/2020 13:40:04 -------------------------------------------------------------------------------- SuperBill Details Patient Name: Date of Service: Richard ChesterBO WE, JO NA THA N 09/21/2020 Medical Record Number: [id number] Patient Account Number: [id number] Date of Birth/Sex: Treating RN: 21-Feb-1976 (44 y.o. Male) Zenaida DeedBoehlein, Linda Primary Care Provider: Marcy SirenWallace, Catherine Other Clinician: Referring Provider: Treating Provider/Extender: Otilio ConnorsHoffman, Tieshia Rettinger Wallace, Catherine Weeks in Treatment: 0 Diagnosis Coding ICD-10 Codes Code Description L89.619 Pressure ulcer of right heel, unspecified stage L89.629 Pressure  ulcer of left heel, unspecified stage F25.8 Other schizoaffective disorders Facility Procedures CPT4 Code: [id number] Description: 99213 - WOUND CARE VISIT-LEV 3 EST PT Modifier: 25 Quantity: 1 CPT4 Code: [id number] Description: 11042 - DEB SUBQ TISSUE 20 SQ CM/< ICD-10 Diagnosis Description L89.619 Pressure ulcer of right heel, unspecified stage L89.629 Pressure ulcer of left heel, unspecified stage Modifier: Quantity: 1 Physician Procedures : CPT4 Code Description Modifier [id number] WC PHYS LEVEL 3 NEW PT ICD-10 Diagnosis Description L89.619 Pressure ulcer of right heel, unspecified stage L89.629 Pressure ulcer  of left heel, unspecified stage Quantity: 1 : [id number] 11042 - WC PHYS SUBQ TISS 20 SQ CM ICD-10 Diagnosis Description L89.619 Pressure ulcer of right heel, unspecified stage L89.629 Pressure ulcer of left heel, unspecified stage Quantity: 1 Electronic Signature(s) Signed: 09/21/2020 5:13:04 PM By: Geralyn Corwin DO Signed: 09/21/2020 5:57:05 PM By: Richard Abts RN, BSN Previous Signature: 09/21/2020 2:37:50 PM Version By: Geralyn Corwin DO Entered By: Richard [name] on 09/21/2020 16:31:20

## 2020-09-22 ENCOUNTER — Telehealth: Payer: Self-pay | Admitting: Internal Medicine

## 2020-09-22 DIAGNOSIS — S91309A Unspecified open wound, unspecified foot, initial encounter: Secondary | ICD-10-CM | POA: Diagnosis not present

## 2020-09-22 NOTE — Telephone Encounter (Signed)
Pt's mother called stating [facility] is not accepting new pt's at this time and would like another place that they could go to for [place].

## 2020-09-22 NOTE — Telephone Encounter (Signed)
Thanks Jane

## 2020-09-26 ENCOUNTER — Other Ambulatory Visit: Payer: Self-pay | Admitting: Family

## 2020-09-26 DIAGNOSIS — F4310 Post-traumatic stress disorder, unspecified: Secondary | ICD-10-CM

## 2020-09-26 DIAGNOSIS — F259 Schizoaffective disorder, unspecified: Secondary | ICD-10-CM

## 2020-09-26 NOTE — Telephone Encounter (Signed)
Per patient's mother request new referral placed to Psychiatry.

## 2020-09-27 ENCOUNTER — Encounter (HOSPITAL_BASED_OUTPATIENT_CLINIC_OR_DEPARTMENT_OTHER): Payer: Medicare HMO | Admitting: Internal Medicine

## 2020-09-30 ENCOUNTER — Telehealth: Payer: Medicare HMO | Admitting: Family

## 2020-10-03 NOTE — Telephone Encounter (Signed)
Patients mother Lynnette Caffey [phone] is asking for a call to update her on the situation with getting a wheelchair for [place].

## 2020-10-05 ENCOUNTER — Encounter (HOSPITAL_BASED_OUTPATIENT_CLINIC_OR_DEPARTMENT_OTHER): Payer: Medicare HMO | Admitting: Internal Medicine

## 2020-10-06 NOTE — Telephone Encounter (Signed)
Attempted to call pt to schedule appt, no answer, unable to leave vm. Will try again.

## 2020-10-07 ENCOUNTER — Telehealth: Payer: Self-pay | Admitting: Internal Medicine

## 2020-10-07 NOTE — Telephone Encounter (Signed)
Patients mother Lynnette Caffey called in asking if patient can be referred to a [facility] Ortho practice. Please call mother Bonita Quin at [phone].

## 2020-10-07 NOTE — Telephone Encounter (Signed)
Marylene Land from [facility] called requesting a shower chair be ordered for the patient. Contact person at [facility] is Patsy Lager [phone] refer to case number [id number].

## 2020-10-10 ENCOUNTER — Other Ambulatory Visit: Payer: Self-pay | Admitting: Family

## 2020-10-10 DIAGNOSIS — Z89439 Acquired absence of unspecified foot: Secondary | ICD-10-CM

## 2020-10-10 NOTE — Telephone Encounter (Signed)
Per mother's request referral placed to [facility].

## 2020-10-10 NOTE — Telephone Encounter (Signed)
Per request order for shower chair placed.

## 2020-10-10 NOTE — Telephone Encounter (Signed)
Please advise 

## 2020-10-10 NOTE — Telephone Encounter (Signed)
Called pt primary phone, no answer. Called pt's work phone, and left message for pt to return call.

## 2020-10-11 ENCOUNTER — Telehealth: Payer: Self-pay | Admitting: Orthopedic Surgery

## 2020-10-11 NOTE — Telephone Encounter (Signed)
Patient mother Lynnette Caffey callled asked if Cyndia Skeeters can provide transportation for the patient in order for him to come to his appointment? Bonita Quin said she will also try his PCP to see if they can provide transportation for the patient to come here for his appointment. The number to contact Bonita Quin is  [phone]

## 2020-10-11 NOTE — Telephone Encounter (Signed)
Spoke with pt and scheduled appt with me for 10/18/20 at 1:30PM

## 2020-10-12 ENCOUNTER — Other Ambulatory Visit: Payer: Self-pay

## 2020-10-12 ENCOUNTER — Encounter (HOSPITAL_BASED_OUTPATIENT_CLINIC_OR_DEPARTMENT_OTHER): Payer: Medicare HMO | Admitting: Physician Assistant

## 2020-10-12 DIAGNOSIS — L89619 Pressure ulcer of right heel, unspecified stage: Secondary | ICD-10-CM | POA: Diagnosis not present

## 2020-10-12 DIAGNOSIS — L97412 Non-pressure chronic ulcer of right heel and midfoot with fat layer exposed: Secondary | ICD-10-CM | POA: Diagnosis not present

## 2020-10-12 DIAGNOSIS — L97422 Non-pressure chronic ulcer of left heel and midfoot with fat layer exposed: Secondary | ICD-10-CM | POA: Diagnosis not present

## 2020-10-12 NOTE — Progress Notes (Addendum)
SHYHEEM, WHITHAM ([id number]) Visit Report for 10/12/2020 Chief Complaint Document Details Patient Name: Date of Service: Philippa Chester [place] THA N 10/12/2020 1:45 PM Medical Record Number: [id number] Patient Account Number: [id number] Date of Birth/Sex: Treating RN: 1975-07-05 (45 y.o. Male) [name] Primary Care Provider: Marcy Siren Other Clinician: Referring Provider: Treating Provider/Extender: Vicente Males in Treatment: 3 Information Obtained from: Patient Chief Complaint Bilateral heel wounds Electronic Signature(s) Signed: 10/12/2020 2:34:56 PM By: Lenda Kelp PA-C Entered By: Lenda Kelp on 10/12/2020 14:34:56 -------------------------------------------------------------------------------- HPI Details Patient Name: Date of Service: Philippa Chester NA THA N 10/12/2020 1:45 PM Medical Record Number: [id number] Patient Account Number: [id number] Date of Birth/Sex: Treating RN: 05-Feb-1976 (44 y.o. Male) [name] Primary Care Provider: Marcy Siren Other Clinician: Referring Provider: Treating Provider/Extender: Vicente Males in Treatment: 3 History of Present Illness HPI Description: Admission 6/8 Mr. Brasen Bundren is a 45 year old male with a past medical history of schizoaffective disorder that presents to our clinic for bilateral heel wounds. He states that while he resided in [place] in March 2022 he developed frostbite and had bilateral transmetatarsal amputations. He also developed heel wounds Post surgery. He has since traveled to [place] and resides with his father. He was seen by Dr. Allyne Gee, orthopedic surgery at [facility] and was referred to our clinic for wound care. He has currently been putting Medihoney on the wound and doing aggressive offloading. He reports improvement in wound healing over the past several months. He denies signs of infection. 10/12/2020 this is a patient who was  actually seen by Dr. Mikey Bussing last week on admission. In fact this was actually on the 8 June it appears not last week. Nonetheless he since that time has been doing quite well. He was given the [facility] but it sounds like he probably ran out of the interim. Fortunately there does not appear to be any signs of active infection which is great news. No fevers, chills, nausea, vomiting, or diarrhea. Electronic Signature(s) Signed: 10/12/2020 3:12:19 PM By: Lenda Kelp PA-C Entered By: Lenda Kelp on 10/12/2020 15:12:19 -------------------------------------------------------------------------------- Physical Exam Details Patient Name: Date of Service: Philippa Chester NA THA N 10/12/2020 1:45 PM Medical Record Number: [id number] Patient Account Number: [id number] Date of Birth/Sex: Treating RN: 05-02-1975 (44 y.o. Male) [name] Primary Care Provider: Marcy Siren Other Clinician: Referring Provider: Treating Provider/Extender: Vicente Males in Treatment: 3 Constitutional Well-nourished and well-hydrated in no acute distress. Respiratory normal breathing without difficulty. Psychiatric this patient is able to make decisions and demonstrates good insight into disease process. Alert and Oriented x 3. pleasant and cooperative. Notes Upon inspection patient's wound bed actually showed signs of good granulation epithelization at this point. He is making great progress in regard to both heels I do not see any signs of active infection at this time. No fevers, chills, nausea, vomiting, or diarrhea. Electronic Signature(s) Signed: 10/12/2020 3:12:35 PM By: Lenda Kelp PA-C Entered By: Lenda Kelp on 10/12/2020 15:12:35 -------------------------------------------------------------------------------- Physician Orders Details Patient Name: Date of Service: Philippa Chester NA THA N 10/12/2020 1:45 PM Medical Record Number: [id number] Patient Account  Number: [id number] Date of Birth/Sex: Treating RN: 19-Mar-1976 (44 y.o. Male) [name] Primary Care Provider: Marcy Siren Other Clinician: Referring Provider: Treating Provider/Extender: Vicente Males in Treatment: 3 Verbal / Phone Orders: No Diagnosis Coding ICD-10 Coding Code Description L89.619 Pressure ulcer of right heel, unspecified stage  G40.102 Pressure ulcer of left heel, unspecified stage F25.8 Other schizoaffective disorders Follow-up Appointments Return Appointment in 2 weeks. Bathing/ Shower/ Hygiene May shower and wash wound with soap and water. Off-Loading Turn and reposition every 2 hours Other: - float heels off of bed/chair with pillow under calves Wound Treatment Wound #1 - Calcaneus Wound Laterality: Plantar, Left Cleanser: Normal Saline (Generic) Every Other Day/30 Days Discharge Instructions: Cleanse the wound with Normal Saline prior to applying a clean dressing using gauze sponges, not tissue or cotton balls. Prim Dressing: Hydrofera Blue Ready Foam, 2.5 x2.5 in Every Other Day/30 Days ary Discharge Instructions: Apply to wound bed as instructed Secondary Dressing: Woven Gauze Sponges 2x2 in (Generic) Every Other Day/30 Days Discharge Instructions: Apply over primary dressing as directed. Secondary Dressing: Zetuvit Plus Silicone Border Dressing 4x4 (in/in) (Generic) Every Other Day/30 Days Discharge Instructions: Apply silicone border over primary dressing as directed. Wound #2 - Calcaneus Wound Laterality: Right Cleanser: Normal Saline (Generic) Every Other Day/30 Days Discharge Instructions: Cleanse the wound with Normal Saline prior to applying a clean dressing using gauze sponges, not tissue or cotton balls. Prim Dressing: Hydrofera Blue Ready Foam, 2.5 x2.5 in Every Other Day/30 Days ary Discharge Instructions: Apply to wound bed as instructed Secondary Dressing: Woven Gauze Sponges 2x2 in (Generic) Every  Other Day/30 Days Discharge Instructions: Apply over primary dressing as directed. Secondary Dressing: Zetuvit Plus Silicone Border Dressing 4x4 (in/in) (Generic) Every Other Day/30 Days Discharge Instructions: Apply silicone border over primary dressing as directed. Electronic Signature(s) Signed: 10/12/2020 4:36:36 PM By: Lenda Kelp PA-C Entered By: Lenda Kelp on 10/12/2020 15:13:34 -------------------------------------------------------------------------------- Problem List Details Patient Name: Date of Service: Philippa Chester NA THA N 10/12/2020 1:45 PM Medical Record Number: [id number] Patient Account Number: [id number] Date of Birth/Sex: Treating RN: Sep 04, 1975 (44 y.o. Male) [name] Primary Care Provider: Marcy Siren Other Clinician: Referring Provider: Treating Provider/Extender: Vicente Males in Treatment: 3 Active Problems ICD-10 Encounter Code Description Active Date MDM Diagnosis L89.619 Pressure ulcer of right heel, unspecified stage 09/21/2020 No Yes L89.629 Pressure ulcer of left heel, unspecified stage 09/21/2020 No Yes F25.8 Other schizoaffective disorders 09/21/2020 No Yes Inactive Problems Resolved Problems Electronic Signature(s) Signed: 10/12/2020 2:34:49 PM By: Lenda Kelp PA-C Entered By: Lenda Kelp on 10/12/2020 14:34:49 -------------------------------------------------------------------------------- Progress Note Details Patient Name: Date of Service: Philippa Chester NA THA N 10/12/2020 1:45 PM Medical Record Number: [id number] Patient Account Number: [id number] Date of Birth/Sex: Treating RN: April 18, 1975 (44 y.o. Male) [name] Primary Care Provider: Marcy Siren Other Clinician: Referring Provider: Treating Provider/Extender: Vicente Males in Treatment: 3 Subjective Chief Complaint Information obtained from Patient Bilateral heel wounds History of Present Illness  (HPI) Admission 6/8 Mr. Sair Faulcon is a 45 year old male with a past medical history of schizoaffective disorder that presents to our clinic for bilateral heel wounds. He states that while he resided in [place] in March 2022 he developed frostbite and had bilateral transmetatarsal amputations. He also developed heel wounds Post surgery. He has since traveled to [place] and resides with his father. He was seen by Dr. Allyne Gee, orthopedic surgery at [facility] and was referred to our clinic for wound care. He has currently been putting Medihoney on the wound and doing aggressive offloading. He reports improvement in wound healing over the past several months. He denies signs of infection. 10/12/2020 this is a patient who was actually seen by Dr. Mikey Bussing last week on admission. In fact  this was actually on the 8 June it appears not last week. Nonetheless he since that time has been doing quite well. He was given the [facility] but it sounds like he probably ran out of the interim. Fortunately there does not appear to be any signs of active infection which is great news. No fevers, chills, nausea, vomiting, or diarrhea. Objective Constitutional Well-nourished and well-hydrated in no acute distress. Vitals Time Taken: 2:27 PM, Height: 70 in, Weight: 134 lbs, BMI: 19.2, Temperature: 97.6 F, Pulse: 71 bpm, Respiratory Rate: 16 breaths/min, Blood Pressure: 102/68 mmHg. Respiratory normal breathing without difficulty. Psychiatric this patient is able to make decisions and demonstrates good insight into disease process. Alert and Oriented x 3. pleasant and cooperative. General Notes: Upon inspection patient's wound bed actually showed signs of good granulation epithelization at this point. He is making great progress in regard to both heels I do not see any signs of active infection at this time. No fevers, chills, nausea, vomiting, or diarrhea. Integumentary (Hair, Skin) Wound #1 status is  Open. Original cause of wound was Frostbite. The date acquired was: 07/15/2020. The wound has been in treatment 3 weeks. The wound is located on the Left,Plantar Calcaneus. The wound measures 0.5cm length x 1.6cm width x 0.2cm depth; 0.628cm^2 area and 0.126cm^3 volume. There is Fat Layer (Subcutaneous Tissue) exposed. There is no tunneling or undermining noted. There is a medium amount of serosanguineous drainage noted. The wound margin is distinct with the outline attached to the wound base. There is medium (34-66%) pink, pale granulation within the wound bed. There is a medium (34- 66%) amount of necrotic tissue within the wound bed including Adherent Slough. Wound #2 status is Open. Original cause of wound was Frostbite. The date acquired was: 07/15/2020. The wound has been in treatment 3 weeks. The wound is located on the Right Calcaneus. The wound measures 0.3cm length x 1cm width x 0.1cm depth; 0.236cm^2 area and 0.024cm^3 volume. There is Fat Layer (Subcutaneous Tissue) exposed. There is no tunneling or undermining noted. There is a medium amount of serosanguineous drainage noted. The wound margin is distinct with the outline attached to the wound base. There is large (67-100%) pink, pale granulation within the wound bed. There is no necrotic tissue within the wound bed. Assessment Active Problems ICD-10 Pressure ulcer of right heel, unspecified stage Pressure ulcer of left heel, unspecified stage Other schizoaffective disorders Plan Follow-up Appointments: Return Appointment in 2 weeks. Bathing/ Shower/ Hygiene: May shower and wash wound with soap and water. Off-Loading: Turn and reposition every 2 hours Other: - float heels off of bed/chair with pillow under calves WOUND #1: - Calcaneus Wound Laterality: Plantar, Left Cleanser: Normal Saline (Generic) Every Other Day/30 Days Discharge Instructions: Cleanse the wound with Normal Saline prior to applying a clean dressing using gauze  sponges, not tissue or cotton balls. Prim Dressing: Hydrofera Blue Ready Foam, 2.5 x2.5 in Every Other Day/30 Days ary Discharge Instructions: Apply to wound bed as instructed Secondary Dressing: Woven Gauze Sponges 2x2 in (Generic) Every Other Day/30 Days Discharge Instructions: Apply over primary dressing as directed. Secondary Dressing: Zetuvit Plus Silicone Border Dressing 4x4 (in/in) (Generic) Every Other Day/30 Days Discharge Instructions: Apply silicone border over primary dressing as directed. WOUND #2: - Calcaneus Wound Laterality: Right Cleanser: Normal Saline (Generic) Every Other Day/30 Days Discharge Instructions: Cleanse the wound with Normal Saline prior to applying a clean dressing using gauze sponges, not tissue or cotton balls. Prim Dressing: Hydrofera Blue Ready Foam, 2.5  x2.5 in Every Other Day/30 Days ary Discharge Instructions: Apply to wound bed as instructed Secondary Dressing: Woven Gauze Sponges 2x2 in (Generic) Every Other Day/30 Days Discharge Instructions: Apply over primary dressing as directed. Secondary Dressing: Zetuvit Plus Silicone Border Dressing 4x4 (in/in) (Generic) Every Other Day/30 Days Discharge Instructions: Apply silicone border over primary dressing as directed. 1. I Am going to [facility] that [facility] I think this is probably the best way to go and he will use on top of this just a large Band- Aid or the dressing supplies that we ordered for him either 1 will be fine. 2. I am also can recommend the patient continue to monitor for any signs of worsening from the standpoint of infection. If anything occurs that he is concerned about such as increased drainage, pain, or otherwise he should let me know soon as possible. We will see patient back for reevaluation in 2 weeks here in the clinic. If anything worsens or changes patient will contact our office for additional recommendations. Electronic Signature(s) Signed:  10/12/2020 3:13:52 PM By: Lenda KelpStone III, Ahlijah Raia PA-C Previous Signature: 10/12/2020 3:13:11 PM Version By: Lenda KelpStone III, Jahn Franchini PA-C Entered By: Lenda KelpStone III, Loza Prell on 10/12/2020 15:13:52 -------------------------------------------------------------------------------- SuperBill Details Patient Name: Date of Service: Philippa ChesterBO WE, JO NA THA N 10/12/2020 Medical Record Number: [id number] Patient Account Number: [id number] Date of Birth/Sex: Treating RN: 09-28-1975 (44 y.o. Male) Zenaida DeedBoehlein, Linda Primary Care Provider: Marcy SirenWallace, Catherine Other Clinician: Referring Provider: Treating Provider/Extender: Vicente MalesStone III, Jonnette Nuon Wallace, Catherine Weeks in Treatment: 3 Diagnosis Coding ICD-10 Codes Code Description [phone] Pressure ulcer of right heel, unspecified stage L89.629 Pressure ulcer of left heel, unspecified stage F25.8 Other schizoaffective disorders Facility Procedures CPT4 Code: [id number] Description: 99214 - WOUND CARE VISIT-LEV 4 EST PT Modifier: Quantity: 1 Physician Procedures Electronic Signature(s) Signed: 10/12/2020 3:14:06 PM By: Lenda KelpStone III, Chesnie Capell PA-C Entered By: Lenda KelpStone III, Jermain Curt on 10/12/2020 15:14:05

## 2020-10-13 ENCOUNTER — Ambulatory Visit (INDEPENDENT_AMBULATORY_CARE_PROVIDER_SITE_OTHER): Payer: Medicare HMO | Admitting: Orthopedic Surgery

## 2020-10-13 DIAGNOSIS — Z89439 Acquired absence of unspecified foot: Secondary | ICD-10-CM

## 2020-10-13 DIAGNOSIS — L97401 Non-pressure chronic ulcer of unspecified heel and midfoot limited to breakdown of skin: Secondary | ICD-10-CM | POA: Diagnosis not present

## 2020-10-14 NOTE — Telephone Encounter (Signed)
Patients mother came in offices asking about a the referral for psychiatry and completed DPR/NO SHOW/LATE POLCY forms.

## 2020-10-18 ENCOUNTER — Ambulatory Visit (INDEPENDENT_AMBULATORY_CARE_PROVIDER_SITE_OTHER): Payer: Medicare HMO | Admitting: Clinical

## 2020-10-18 ENCOUNTER — Other Ambulatory Visit: Payer: Self-pay

## 2020-10-18 ENCOUNTER — Encounter: Payer: Self-pay | Admitting: Orthopedic Surgery

## 2020-10-18 DIAGNOSIS — F411 Generalized anxiety disorder: Secondary | ICD-10-CM | POA: Diagnosis not present

## 2020-10-18 DIAGNOSIS — F259 Schizoaffective disorder, unspecified: Secondary | ICD-10-CM | POA: Diagnosis not present

## 2020-10-18 DIAGNOSIS — R69 Illness, unspecified: Secondary | ICD-10-CM | POA: Diagnosis not present

## 2020-10-18 NOTE — BH Specialist Note (Signed)
Integrated Behavioral Health Initial In-Person Visit  MRN: [id number] Name: Richard [name]  Number of Integrated Behavioral Health Clinician visits:: 1/6 Session Start time: 1:45PM  Session End time: 2:32 PM Total time:  47  minutes  Types of Service: Individual psychotherapy  Interpretor:No. Interpretor Name and Language: N/a   Warm Hand Off Completed.         Subjective: Richard [name] is a 45 y.o. male accompanied by  self Patient was referred by PCP for schizoaffective disoder and alcohol use. Patient reports the following symptoms/concerns: Reports concerns with alcohol use, marijuana use, anxiety, and hx of schizoaffective disorder. Duration of problem: 1 + year; Severity of problem: moderate  Objective: Mood: Anxious and Affect: Appropriate Risk of harm to self or others: No plan to harm self or others  Life Context: Family and Social: Reports that he receives support system from his mother and father. School/Work: Patient is currently unemployed and he receives SSI.  Self-Care: Pt reports that he utilizes alcohol and marijuana as coping skills.  Life Changes: Pt reports that he got frostbite while working which caused him to be unable to walk and have surgery. Pt currently utilizes motorized scooter and is having difficulty adjusting to not having as much mobility as he had in the past.  Patient and/or Family's Strengths/Protective Factors: Social connections and Concrete supports in place (healthy food, safe environments, etc.)  Goals Addressed: Patient will: Reduce symptoms of: anxiety, insomnia, mood instability, and stress Increase knowledge and/or ability of: coping skills and stress reduction  Demonstrate ability to: Increase healthy adjustment to current life circumstances and Decrease self-medicating behaviors  Progress towards Goals: Ongoing  Interventions: Interventions utilized: Mindfulness or Management consultant, CBT Cognitive Behavioral Therapy,  Supportive Counseling, Psychoeducation and/or Health Education, and Link to [facility]  Standardized Assessments completed:  MDQ, GAD-7, and PHQ 9  Patient and/or Family Response: Pt receptive to plan and agreed to FU with LCSW.  Patient Centered Plan: Patient is on the following Treatment Plan(s):  Pt to FU with LCSW and reports he has an appt scheduled with outpatient BH in [place] in one month for psychiatry.   Assessment: Pt presents with anxious mood with an appropriate affect. Denies SI/HI. No safety risks. Denies auditory/visual hallucinations, though pt chart suggests hx of schizoaffective disorder. Pt reports that he experiences excessive anxiousness and feels sad at times. 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 Reports that in the past he consumed over 80oz/day but has decreased use since his injury. Reports marijuana use multiple times/day. Pt is uninterested in alcohol and marijuana cessation. Reports that he has had difficulty adjusting to recent life changes with being unable to walk. Reports that he does not like having to rely on family. Reports that he is currently staying with his father and receives support from both parents. Reports that he wishes he was able to work. Patient currently experiencing anxiety.   Pt was fidgety during session and had difficulty remaining still. Pt has hx of schizoaffective disorder though he is denying auditory or visual hallucinations. Patient may benefit from medication management and therapy. Pt may also benefit from deep breathing exercises and meditation. LCSW provided psychoeducation on alcohol and marijuana use and encouraged pt to decrease use.   Plan: Follow up with behavioral health clinician on : 11/15/20 Behavioral recommendations: Utilize deep breathing exercises, utilize meditation, attend appt for medication management  Referral(s): Integrated Art gallery manager (In Clinic) and [facility]:   Housing "From scale of 1-10, how likely are  you to follow plan?": 10  Richard [name] C Richard Abdou, LCSW

## 2020-10-18 NOTE — Progress Notes (Addendum)
ANIAS, BARTOL ([id number]) Visit Report for 10/12/2020 Arrival Information Details Patient Name: Date of Service: Richard [name] Richard [name] 10/12/2020 1:45 PM Medical Record Number: [id number] Patient Account Number: [id number] Date of Birth/Sex: Treating RN: [birth year] (45 y.o. Male) Antonieta Iba Primary Care Zariah Cavendish: Marcy Siren Other Clinician: Referring Traye Bates: Treating Elektra Wartman/Extender: Vicente Males in Treatment: 3 Visit Information History Since Last Visit Added or deleted any medications: No Patient Arrived: Wheel Chair Any new allergies or adverse reactions: No Arrival Time: 14:23 Had a fall or experienced change in No Transfer Assistance: None activities of daily living that may affect Patient Identification Verified: Yes risk of falls: Secondary Verification Process Completed: Yes Signs or symptoms of abuse/neglect since last visito No Patient Requires Transmission-Based Precautions: No Hospitalized since last visit: No Patient Has Alerts: No Implantable device outside of the clinic excluding No cellular tissue based products placed in the center since last visit: Has Dressing in Place as Prescribed: Yes Pain Present Now: No Electronic Signature(s) Signed: 10/12/2020 5:38:09 PM By: Antonieta Iba Entered By: Antonieta Iba on 10/12/2020 14:27:26 -------------------------------------------------------------------------------- Clinic Level of Care Assessment Details Patient Name: Date of Service: Richard [name] NA THA [name] 10/12/2020 1:45 PM Medical Record Number: [id number] Patient Account Number: [id number] Date of Birth/Sex: Treating RN: 01/21/1976 (44 y.o. Male) [name] Primary Care Canna Nickelson: Marcy Siren Other Clinician: Referring Osker Ayoub: Treating Charlena Haub/Extender: Vicente Males in Treatment: 3 Clinic Level of Care Assessment Items TOOL 4 Quantity Score []  - 0 Use when only an EandM is  performed on FOLLOW-UP visit ASSESSMENTS - Nursing Assessment / Reassessment X- 1 10 Reassessment of Co-morbidities (includes updates in patient status) X- 1 5 Reassessment of Adherence to Treatment Plan ASSESSMENTS - Wound and Skin A ssessment / Reassessment []  - 0 Simple Wound Assessment / Reassessment - one wound X- 2 5 Complex Wound Assessment / Reassessment - multiple wounds []  - 0 Dermatologic / Skin Assessment (not related to wound area) ASSESSMENTS - Focused Assessment []  - 0 Circumferential Edema Measurements - multi extremities []  - 0 Nutritional Assessment / Counseling / Intervention X- 1 5 Lower Extremity Assessment (monofilament, tuning fork, pulses) []  - 0 Peripheral Arterial Disease Assessment (using hand held doppler) ASSESSMENTS - Ostomy and/or Continence Assessment and Care []  - 0 Incontinence Assessment and Management []  - 0 Ostomy Care Assessment and Management (repouching, etc.) PROCESS - Coordination of Care X - Simple Patient / Family Education for ongoing care 1 15 []  - 0 Complex (extensive) Patient / Family Education for ongoing care X- 1 10 Staff obtains Consents, Records, T Results / Process Orders est []  - 0 Staff telephones HHA, Nursing Homes / Clarify orders / etc []  - 0 Routine Transfer to another Facility (non-emergent condition) []  - 0 Routine Hospital Admission (non-emergent condition) []  - 0 New Admissions / / Ordering NPWT Apligraf, etc. , []  - 0 Emergency Hospital Admission (emergent condition) X- 1 10 Simple Discharge Coordination []  - 0 Complex (extensive) Discharge Coordination PROCESS - Special Needs []  - 0 Pediatric / Minor Patient Management []  - 0 Isolation Patient Management []  - 0 Hearing / Language / Visual special needs []  - 0 Assessment of Community assistance (transportation, D/C planning, etc.) []  - 0 Additional assistance / Altered mentation []  - 0 Support Surface(s) Assessment  (bed, cushion, seat, etc.) INTERVENTIONS - Wound Cleansing / Measurement []  - 0 Simple Wound Cleansing - one wound X- 2 5 Complex Wound Cleansing - multiple wounds X-  1 5 Wound Imaging (photographs - any number of wounds) []  - 0 Wound Tracing (instead of photographs) []  - 0 Simple Wound Measurement - one wound X- 2 5 Complex Wound Measurement - multiple wounds INTERVENTIONS - Wound Dressings X - Small Wound Dressing one or multiple wounds 2 10 []  - 0 Medium Wound Dressing one or multiple wounds []  - 0 Large Wound Dressing one or multiple wounds X- 1 5 Application of Medications - topical []  - 0 Application of Medications - injection INTERVENTIONS - Miscellaneous []  - 0 External ear exam []  - 0 Specimen Collection (cultures, biopsies, blood, body fluids, etc.) []  - 0 Specimen(s) / Culture(s) sent or taken to Lab for analysis []  - 0 Patient Transfer (multiple staff / / Similar devices) []  - 0 Simple Staple / Suture removal (25 or less) []  - 0 Complex Staple / Suture removal (26 or more) []  - 0 Hypo / Hyperglycemic Management (close monitor of Blood Glucose) []  - 0 Ankle / Brachial Index (ABI) - do not check if billed separately X- 1 5 Vital Signs Has the patient been seen at the hospital within the last three years: Yes Total Score: 120 Level Of Care: New/Established - Level 4 Electronic Signature(s) Signed: 10/12/2020 5:41:46 PM By: RN, BSN Entered By: on 10/12/2020 15:11:26 -------------------------------------------------------------------------------- Encounter Discharge Information Details Patient Name: Date of Service: NA THA [name] 10/12/2020 1:45 PM Medical Record Number: Patient Account Number: Date of Birth/Sex: Treating RN: 05-19-1975 (45 y.o. Male) Primary Care Alle Difabio: Other Clinician: Referring Gal Feldhaus: Treating Elimelech Houseman/Extender: in Treatment: 3 Encounter Discharge Information Items Discharge Condition: Stable Ambulatory Status: Wheelchair Discharge Destination: Home Transportation: Other Accompanied By: alone Schedule Follow-up Appointment: No Clinical Summary of Care: Patient Declined Electronic Signature(s) Signed: 10/18/2020 6:17:43 PM By: [name] Entered By: [name] on 10/12/2020 16:54:58 -------------------------------------------------------------------------------- Lower Extremity Assessment Details Patient Name: Date of Service: Richard [name] NA THA [name] 10/12/2020 1:45 PM Medical Record Number: [id number] Patient Account Number: [id number] Date of Birth/Sex: Treating RN: 1976/03/20 (45 y.o. Male) Rolan Lipa Primary Care Meylin Stenzel: Marcy Siren Other Clinician: Referring Derriana Oser: Treating Ameriah Lint/Extender: Vicente Males Weeks in Treatment: 3 Edema Assessment Assessed: 12/19/2020: Yes] [Right: Yes] Edema: [Left: No] [Right: No] Vascular Assessment Pulses: Dorsalis Pedis Palpable: [Left:Yes] [Right:Yes] Electronic Signature(s) Signed: 10/12/2020 5:38:09 PM By: Rolan Lipa Entered By: 10/14/2020 on 10/12/2020 14:34:31 -------------------------------------------------------------------------------- Multi-Disciplinary Care Plan Details Patient Name: Date of Service: 10/14/2020 NA THA [name] 10/12/2020 1:45 PM Medical Record Number: [id number] Patient Account Number: 02/02/1976 Date of Birth/Sex: Treating RN: 11/23/1975 (44 y.o. Male) Marcy Siren Primary Care Lenix Kidd: Elby Beck Other Clinician: Referring Sheppard Luckenbach: Treating Karishma Unrein/Extender: Kyra Searles in Treatment: 3 Multidisciplinary Care Plan reviewed with physician Active Inactive Electronic Signature(s) Signed: 01/12/2021 3:07:54 PM By: Antonieta Iba RN, BSN Previous Signature: 10/12/2020 5:41:46 PM Version By: 10/14/2020 RN, BSN Entered By: Richard [name] on 12/12/2020 12:57:51 -------------------------------------------------------------------------------- Pain Assessment Details Patient Name: Date of Service: [id number] NA THA [name] 10/12/2020 1:45 PM Medical Record Number: 02/02/1976 Patient Account Number: [id number] Date of Birth/Sex: Treating RN: 1976-03-16 (44 y.o. Male) Vicente Males Primary Care Joette Schmoker: 01/14/2021 Other Clinician: Referring Mcdonald Reiling: Treating Crimson Beer/Extender: [name] in Treatment: 3 Active Problems Location of Pain Severity and Description of Pain Patient Has Paino No Site Locations Pain Management and Medication Current  Pain Management: Electronic Signature(s) Signed: 10/12/2020 5:38:09 PM By: Antonieta IbaBarnhart, Jodi Entered By: Antonieta IbaBarnhart, Jodi on 10/12/2020 14:27:56 -------------------------------------------------------------------------------- Patient/Caregiver Education Details Patient Name: Date of Service: Richard [name], Richard [name] 6/29/2022andnbsp1:45 PM Medical Record Number: [id number] Patient Account Number: [id number] Date of Birth/Gender: Treating RN: [birth year] (44 y.o. Male) Zenaida DeedBoehlein, Linda Primary Care Physician: Marcy SirenWallace, Catherine Other Clinician: Referring Physician: Treating Physician/Extender: Vicente MalesStone III, Hoyt Wallace, Catherine Weeks in Treatment: 3 Education Assessment Education Provided To: Patient Education Topics Provided Pressure: Methods: Explain/Verbal Responses: Reinforcements needed, State content correctly Wound/Skin Impairment: Methods: Explain/Verbal Responses: Reinforcements needed, State content correctly Electronic Signature(s) Signed: 10/12/2020 5:41:46 PM By: Zenaida DeedBoehlein, Linda RN, BSN Entered By: Zenaida DeedBoehlein, Linda on 10/12/2020 14:50:09 -------------------------------------------------------------------------------- Wound Assessment Details Patient Name: Date of Service: Richard [name], Richard [name]  10/12/2020 1:45 PM Medical Record Number: [id number] Patient Account Number: [id number] Date of Birth/Sex: Treating RN: [birth year] (44 y.o. Male) Antonieta IbaBarnhart, Jodi Primary Care Nasser Ku: Marcy SirenWallace, Catherine Other Clinician: Referring Lesean Woolverton: Treating Winefred Hillesheim/Extender: Vicente MalesStone III, Hoyt Wallace, Catherine Weeks in Treatment: 3 Wound Status Wound Number: 1 Primary Etiology: Pressure Ulcer Wound Location: Left, Plantar Calcaneus Wound Status: Open Wounding Event: Frostbite Date Acquired: 07/15/2020 Weeks Of Treatment: 3 Clustered Wound: No Photos Photo Uploaded By: Karl Itoawkins, Destiny on 10/14/2020 08:57:45 Wound Measurements Length: (cm) 0.5 Width: (cm) 1.6 Depth: (cm) 0.2 Area: (cm) 0.628 Volume: (cm) 0.126 % Reduction in Area: 25.9% % Reduction in Volume: 25.9% Epithelialization: Small (1-33%) Tunneling: No Undermining: No Wound Description Classification: Category/Stage III Wound Margin: Distinct, outline attached Exudate Amount: Medium Exudate Type: Serosanguineous Exudate Color: red, brown Foul Odor After Cleansing: No Slough/Fibrino Yes Wound Bed Granulation Amount: Medium (34-66%) Exposed Structure Granulation Quality: Pink, Pale Fascia Exposed: No Necrotic Amount: Medium (34-66%) Fat Layer (Subcutaneous Tissue) Exposed: Yes Necrotic Quality: Adherent Slough Tendon Exposed: No Muscle Exposed: No Joint Exposed: No Bone Exposed: No Electronic Signature(s) Signed: 10/12/2020 5:38:09 PM By: Antonieta IbaBarnhart, Jodi Entered By: Antonieta IbaBarnhart, Jodi on 10/12/2020 14:31:27 -------------------------------------------------------------------------------- Wound Assessment Details Patient Name: Date of Service: Richard [name], Richard [name] 10/12/2020 1:45 PM Medical Record Number: [id number] Patient Account Number: [id number] Date of Birth/Sex: Treating RN: [birth year] (44 y.o. Male) Antonieta IbaBarnhart, Jodi Primary Care Safaa Stingley: Marcy SirenWallace, Catherine Other Clinician: Referring Wynter Isaacs: Treating  Kirrah Mustin/Extender: Vicente MalesStone III, Hoyt Wallace, Catherine Weeks in Treatment: 3 Wound Status Wound Number: 2 Primary Etiology: Pressure Ulcer Wound Location: Right Calcaneus Wound Status: Open Wounding Event: Frostbite Date Acquired: 07/15/2020 Weeks Of Treatment: 3 Clustered Wound: No Photos Photo Uploaded By: Karl Itoawkins, Destiny on 10/14/2020 08:57:46 Wound Measurements Length: (cm) 0.3 Width: (cm) 1 Depth: (cm) 0.1 Area: (cm) 0.236 Volume: (cm) 0.024 % Reduction in Area: 59.9% % Reduction in Volume: 79.7% Epithelialization: None Tunneling: No Undermining: No Wound Description Classification: Category/Stage III Wound Margin: Distinct, outline attached Exudate Amount: Medium Exudate Type: Serosanguineous Exudate Color: red, brown Foul Odor After Cleansing: No Slough/Fibrino No Wound Bed Granulation Amount: Large (67-100%) Exposed Structure Granulation Quality: Pink, Pale Fascia Exposed: No Necrotic Amount: None Present (0%) Fat Layer (Subcutaneous Tissue) Exposed: Yes Tendon Exposed: No Muscle Exposed: No Joint Exposed: No Bone Exposed: No Electronic Signature(s) Signed: 10/12/2020 5:38:09 PM By: Antonieta IbaBarnhart, Jodi Entered By: Antonieta IbaBarnhart, Jodi on 10/12/2020 14:33:21 -------------------------------------------------------------------------------- Vitals Details Patient Name: Date of Service: Richard [name], Richard [name] 10/12/2020 1:45 PM Medical Record Number: [id number] Patient Account Number: [id number] Date of Birth/Sex: Treating RN: [birth year] (44 y.o. Male) Antonieta IbaBarnhart, Jodi Primary Care Shericka Johnstone: Marcy SirenWallace, Catherine Other Clinician: Referring Shaquaya Wuellner: Treating Samyria Rudie/Extender: Vicente MalesStone III, Hoyt Wallace, Catherine Weeks in Treatment:  3 Vital Signs Time Taken: 14:27 Temperature (F): 97.6 Height (in): 70 Pulse (bpm): 71 Weight (lbs): 134 Respiratory Rate (breaths/min): 16 Body Mass Index (BMI): 19.2 Blood Pressure (mmHg): 102/68 Reference Range: 80 - 120 mg /  dl Electronic Signature(s) Signed: 10/12/2020 5:38:09 PM By: Antonieta Iba Entered By: Antonieta Iba on 10/12/2020 14:27:49

## 2020-10-18 NOTE — Progress Notes (Signed)
Office Visit Note   Patient: Richard [name]           Date of Birth: 06-10-1975           MRN: [id number] Visit Date: 10/13/2020              Requested by: Rema Fendt, NP 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  Patient presents with bilateral decubitus heel ulcers.  Patient currently ambulates in a motorized wheelchair. Assessment & Plan: Visit Diagnoses:  1. Heel ulcer, unspecified laterality, limited to breakdown of skin (HCC)   2. History of Chopart amputation (HCC)     Plan: We will apply PRAFO boots bilaterally to unload pressure from the heel he will wear these at all times  Follow-Up Instructions: Return in about 4 weeks (around 11/10/2020).   Ortho Exam  Patient is alert, oriented, no adenopathy, well-dressed, normal affect, normal respiratory effort. Examination patient has a good dorsalis pedis pulse bilaterally there is a show poor amputation of both feet with well-healed incisions.  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  There is no cellulitis no odor no drainage no exposed bone or tendon.  Imaging: No results found. No images are attached to the encounter.  Labs: No results found for: HGBA1C, ESRSEDRATE, CRP, LABURIC, REPTSTATUS, GRAMSTAIN, CULT, LABORGA   Lab Results  Component Value Date   ALBUMIN 4.7 12/18/2015    No results found for: MG No results found for: VD25OH  No results found for: PREALBUMIN CBC EXTENDED Latest Ref Rng & Units 12/18/2015  WBC 4.0 - 10.5 K/uL 7.2  RBC  4.22 - 5.81 MIL/uL 4.94  HGB 13.0 - 17.0 g/dL 19.1  HCT 47.8 - 29.5 % 44.5  PLT 150 - 400 K/uL 357  NEUTROABS 1.7 - 7.7 K/uL 3.7  LYMPHSABS 0.7 - 4.0 K/uL 2.2     There is no height or weight on file to calculate BMI.  Orders:  No orders of the defined types were placed in this encounter.  No orders of the defined types were placed in this encounter.    Procedures: No procedures performed  Clinical Data: No additional findings.  ROS:  All other systems negative, except as noted in the HPI. Review of Systems  Objective: Vital Signs: There were no vitals taken for this visit.  Specialty Comments:  No specialty comments available.  PMFS History: There are no problems to display for this patient.  Past Medical History:  Diagnosis Date   Acid reflux    Bipolar 1 disorder (HCC)    Depression    OCD (obsessive compulsive disorder)    Schizo affective schizophrenia (HCC)    Schizoaffective disorder (HCC)     History reviewed. No pertinent family history.  Past Surgical History:  Procedure Laterality Date   MOUTH SURGERY     Social History   Occupational History   Not on file  Tobacco Use   Smoking status: Every Day    Packs/day: 1.00    Pack  years: 0.00    Types: Cigarettes   Smokeless tobacco: Never  Substance and Sexual Activity   Alcohol use: Yes    Comment: 2 beers per day    Drug use: Yes    Types: Marijuana   Sexual activity: Not on file

## 2020-10-24 ENCOUNTER — Ambulatory Visit (INDEPENDENT_AMBULATORY_CARE_PROVIDER_SITE_OTHER): Payer: Medicare HMO | Admitting: Family

## 2020-10-24 ENCOUNTER — Other Ambulatory Visit: Payer: Self-pay

## 2020-10-24 ENCOUNTER — Encounter: Payer: Self-pay | Admitting: Family

## 2020-10-24 VITALS — BP 106/76 | HR 85 | Temp 97.9°F | Resp 18 | Wt 145.0 lb

## 2020-10-24 DIAGNOSIS — Z13220 Encounter for screening for lipoid disorders: Secondary | ICD-10-CM | POA: Diagnosis not present

## 2020-10-24 DIAGNOSIS — Z131 Encounter for screening for diabetes mellitus: Secondary | ICD-10-CM | POA: Diagnosis not present

## 2020-10-24 DIAGNOSIS — Z1159 Encounter for screening for other viral diseases: Secondary | ICD-10-CM | POA: Diagnosis not present

## 2020-10-24 DIAGNOSIS — Z130 Encounter for screening for diseases of the blood and blood-forming organs and certain disorders involving the immune mechanism: Secondary | ICD-10-CM | POA: Diagnosis not present

## 2020-10-24 DIAGNOSIS — Z0000 Encounter for general adult medical examination without abnormal findings: Secondary | ICD-10-CM | POA: Diagnosis not present

## 2020-10-24 DIAGNOSIS — Z13228 Encounter for screening for other metabolic disorders: Secondary | ICD-10-CM

## 2020-10-24 DIAGNOSIS — Z114 Encounter for screening for human immunodeficiency virus [HIV]: Secondary | ICD-10-CM

## 2020-10-24 DIAGNOSIS — Z7689 Persons encountering health services in other specified circumstances: Secondary | ICD-10-CM

## 2020-10-24 DIAGNOSIS — R69 Illness, unspecified: Secondary | ICD-10-CM | POA: Diagnosis not present

## 2020-10-24 DIAGNOSIS — Z1329 Encounter for screening for other suspected endocrine disorder: Secondary | ICD-10-CM | POA: Diagnosis not present

## 2020-10-24 NOTE — Progress Notes (Addendum)
Medicare Annual Wellness Visit, Subsequent   Richard [name] is a 45 y.o. male who presents for a mobility assessment and Medicare Annual Wellness Visit, Subsequent.   The primary reason for today's visit is a mobility assessment. Patient requiring power wheelchair after bilateral feet Chopart amputation around April 2022 related to frostbite while living in [place], he was homeless at that time. Currently he is living in the home with his father. He is present today in a loaner power wheelchair from [employer] and reports he received the same a couple weeks ago. He is awaiting to receive his personal power wheelchair from the same company.  He is followed by [facility]. Most recent visit 10/13/2020 with Meridee Score, MD. During that visit patient seen for heel ulcers. Discharge plan was to apply Prafo boots bilaterally to unload pressure from the heel and the wear at all times. Instructed to return in 4 weeks around 11/10/2020. Reports he has plans to get bilateral prostheses. Today patient has no other issues and/or concerns.   Health Maintenance Due  Topic Date Due   COVID-19 Vaccine (1) Never done   Pneumococcal Vaccine 1-65 Years old (1 - PCV) Never done   Hepatitis C Screening  Never done   INFLUENZA VACCINE  11/14/2020    Health Habits Exercise: none Diet:  balanced     Depression Screen Over the past two weeks have you:     Felt down or depressed? no     Had little interest or pleasure in doing things? no Depression screen [facility] 2/9 10/24/2020 10/18/2020 09/14/2020  Decreased Interest 0 1 3  Down, Depressed, Hopeless 0 1 3  PHQ - 2 Score 0 2 6  Altered sleeping 0 3 3  Tired, decreased energy 0 1 3  Change in appetite 0 1 3  Feeling bad or failure about yourself  0 1 3  Trouble concentrating 0 3 0  Moving slowly or fidgety/restless 0 3 0  Suicidal thoughts 0 0 0  PHQ-9 Score 0 14 18    Functional Ability Does the patient need help with: Ron Parker index)          Bathing: no         Dressing : no         Toileting: no         Transferring: no         Continence: No         Feeding: no Comments: reports he is able to complete all activities of daily living independently but does have some hesitancy because he does not want to fall especially with transferring.           Hearing Evaluation Do you have trouble hearing the television when others do not? no Do you have to strain to hear/understand conversations? no   Falls Risk: Does the patient need assistance with ambulation? Currently wheelchair bound  Does the patient have a history of a fall in the last 90 days? no Is the patient at risk for falls? yes Was the patient's timed "Get Up and Go Test" unsteady or longer than 30 seconds? Unable to perform during today's visit.   Advanced Care Planning Patient has executed an Advance Directive: no  If no, patient was given the opportunity to execute an Advance Directive today? yes This patient has the ability to prepare an Advance Directive: yes   Cognitive Assessment Does the patient have evidence of cognitive impairment? No The patient does not have  evidence of a change in mood/affect, appearance, speech, memory or motor skills.   MMSE - Mini Mental State Exam 10/24/2020  Not completed: Refused    PHYSICAL EXAM: Vitals:   10/24/20 1522  BP: 106/76  Pulse: 85  Resp: 18  Temp: 97.9 F (36.6 C)  SpO2: 94%  Weight: 145 lb (65.8 kg)   Physical Exam Constitutional:      Appearance: He is normal weight.  HENT:     Head: Normocephalic and atraumatic.     Right Ear: Tympanic membrane, ear canal and external ear normal.     Left Ear: Tympanic membrane, ear canal and external ear normal.  Eyes:     Extraocular Movements: Extraocular movements intact.     Conjunctiva/sclera: Conjunctivae normal.     Pupils: Pupils are equal, round, and reactive to light.  Cardiovascular:     Rate and Rhythm: Normal rate and regular rhythm.     Pulses:  Normal pulses.     Heart sounds: Normal heart sounds.  Pulmonary:     Effort: Pulmonary effort is normal.     Breath sounds: Normal breath sounds.  Abdominal:     General: Bowel sounds are normal.     Palpations: Abdomen is soft.  Genitourinary:    Comments: Patient declined exam.  Musculoskeletal:        General: Normal range of motion.     Cervical back: Normal range of motion and neck supple.     Comments: Bilateral feet wrapped in ace bandages with Prafo boots in place.   Skin:    General: Skin is warm and dry.     Capillary Refill: Capillary refill takes less than 2 seconds.  Neurological:     General: No focal deficit present.     Mental Status: He is alert and oriented to person, place, and time.  Psychiatric:        Mood and Affect: Mood normal.        Behavior: Behavior normal.   Assessment and Plan: 1. Encounter for power mobility device assessment: - Status post bilateral feet Chopart amputation in April 2022. Surgery completed while living in [place], he was homeless at that time. Patient currently living in the home with his father. - Patient presents today in loaner power wheelchair from [employer]. He is awaiting personal power wheelchair from the same company, this may take several weeks.  - Keep appointment on 11/10/2020 with Meridee Score, MD at [facility].  - Referral to Physical Therapy for further evaluation and management for wheelchair evaluation.  - Ambulatory referral to Physical Therapy  2. Medicare annual wellness visit, subsequent: - Counseled on healthy eating (including decreased daily intake of saturated fats, cholesterol, added sugars, sodium), STI prevention, and routine healthcare maintenance.  3. Screening for metabolic disorder: - LMB86+LJQG to check kidney function, liver function, and electrolyte balance.  - CMP14+EGFR; Future  4. Screening for deficiency anemia: - CBC to screen for anemia. - CBC; Future  5. Diabetes  mellitus screening: - Hemoglobin A1c to screen for pre-diabetes/diabetes. - Hemoglobin A1c; Future  6. Screening cholesterol level: - Lipid panel to screen for high cholesterol.  - Lipid panel; Future  7. Thyroid disorder screen: - TSH to check thyroid function.  - TSH; Future  8. Need for hepatitis C screening test: - Hepatitis C antibody to screen for hepatitis C.  - Hepatitis C Antibody; Future  9. Encounter for screening for HIV: - HIV antibody to screen for human immunodeficiency virus.  -  HIV antibody (with reflex); Future  The following orders were placed at today's visit; Orders Placed This Encounter  Procedures   CBC   CMP14+EGFR   Hemoglobin A1c   Hepatitis C Antibody   HIV antibody (with reflex)   Lipid panel   TSH   Ambulatory referral to Physical Therapy     An after visit summary with all of these plans was given to the patient.  Durene Fruits, NP  Family Medicine [facility] [phone]

## 2020-10-24 NOTE — Progress Notes (Signed)
Pt presents for medicare annual exam, pt has no other concerns Pt in need of new power wheelchair-[employer]

## 2020-10-26 ENCOUNTER — Encounter (HOSPITAL_BASED_OUTPATIENT_CLINIC_OR_DEPARTMENT_OTHER): Payer: Medicare HMO | Attending: Physician Assistant | Admitting: Physician Assistant

## 2020-10-31 ENCOUNTER — Telehealth: Payer: Self-pay | Admitting: Internal Medicine

## 2020-10-31 NOTE — Telephone Encounter (Signed)
Patients mom called asking if an order for a manual wheelchair can be put in. Patients wheelchair is in bad shape.

## 2020-11-01 ENCOUNTER — Other Ambulatory Visit: Payer: Medicare HMO

## 2020-11-10 ENCOUNTER — Encounter: Payer: Self-pay | Admitting: Orthopedic Surgery

## 2020-11-10 ENCOUNTER — Other Ambulatory Visit: Payer: Self-pay

## 2020-11-10 ENCOUNTER — Ambulatory Visit (INDEPENDENT_AMBULATORY_CARE_PROVIDER_SITE_OTHER): Payer: Medicare HMO | Admitting: Orthopedic Surgery

## 2020-11-10 DIAGNOSIS — Z89432 Acquired absence of left foot: Secondary | ICD-10-CM

## 2020-11-10 DIAGNOSIS — Z89439 Acquired absence of unspecified foot: Secondary | ICD-10-CM

## 2020-11-11 ENCOUNTER — Other Ambulatory Visit: Payer: Medicare HMO

## 2020-11-15 ENCOUNTER — Ambulatory Visit (INDEPENDENT_AMBULATORY_CARE_PROVIDER_SITE_OTHER): Payer: Medicare HMO | Admitting: Clinical

## 2020-11-15 ENCOUNTER — Other Ambulatory Visit: Payer: Self-pay

## 2020-11-15 DIAGNOSIS — F259 Schizoaffective disorder, unspecified: Secondary | ICD-10-CM | POA: Diagnosis not present

## 2020-11-15 DIAGNOSIS — F419 Anxiety disorder, unspecified: Secondary | ICD-10-CM | POA: Diagnosis not present

## 2020-11-15 DIAGNOSIS — F1220 Cannabis dependence, uncomplicated: Secondary | ICD-10-CM | POA: Diagnosis not present

## 2020-11-15 DIAGNOSIS — R69 Illness, unspecified: Secondary | ICD-10-CM | POA: Diagnosis not present

## 2020-11-16 NOTE — BH Specialist Note (Signed)
Integrated Behavioral Health Follow Up In-Person Visit  MRN: [id number] Name: Richard [name]  Number of Integrated Behavioral Health Clinician visits: 1/6 Session Start time: 1:40pm Session End time: 2:30pm Total time: 50  minutes  Types of Service: Individual psychotherapy  Interpretor:No. Interpretor Name and Language: N/A  Subjective: Richard [name] is a 45 y.o. male accompanied by  self Patient was referred by PCP for hx of schizoaffective disoder and alcohol use. Patient reports the following symptoms/concerns: Reports concerns with alcohol use, marijuana use, anxiety, and hx of schizoaffective disorder. Duration of problem: 1+ year; Severity of problem: moderate  Objective: Mood: Anxious and Affect: Appropriate Risk of harm to self or others: No plan to harm self or others  Life Context: Family and Social: Reports that he receives support system from his mother and father. School/Work: Patient is currently unemployed and he receives SSI.  Self-Care: Pt reports that he utilizes alcohol and marijuana as coping skills.  Life Changes: Pt reports that he got frostbite while working which caused him to be unable to walk and have surgery. Pt currently utilizes motorized scooter and is having difficulty adjusting to not having as much mobility as he had in the past.  Patient and/or Family's Strengths/Protective Factors: Social connections and Concrete supports in place (healthy food, safe environments, etc.)  Goals Addressed: Patient will:  Reduce symptoms of: compulsions, insomnia, mood instability, and stress   Increase knowledge and/or ability of: coping skills, healthy habits, and stress reduction   Demonstrate ability to: Increase healthy adjustment to current life circumstances and Decrease self-medicating behaviors  Progress towards Goals: Ongoing  Interventions: Interventions utilized:  Mindfulness or Management consultant, CBT Cognitive Behavioral Therapy, Supportive  Counseling, Psychoeducation and/or Health Education, and Link to [facility] Standardized Assessments completed: Not Needed  Patient and/or Family Response: Pt receptive to plan.  Patient Centered Plan: Patient is on the following Treatment Plan(s): Pt to fu with LCSWA and to attend appt with psychiatry with [facility] in [place] on 12/20/20.  Assessment: Pt presents with anxious mood with an appropriate affect. Denies SI/HI. Endorses auditory hallucinations. Reports hearing "whispers" but not being able to understand what they are saying. Denies visual hallucinations. No safety risks. Pt had difficulty remaining still during session as he was constantly moving his motorized scooter around during session. Pt endorses continued anxiety and fidgeting. Reports that he has gotten more sleep due to going to the beach and spending time with son. 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 Reports using cocaine, "pills", and "other" substances in the past. Reports that he stopped using those substances  several years ago. Acknowledges substance use as a coping skill. Reports having difficulty feeling supported during childhood and difficulty in school. Pt apparently has a hx of trauma.Patient currently experiencing anxiety and auditory hallucinations.   Patient may benefit from medication management and therapy. Pt would also benefit from stopping marijuana and alcohol use though he is uninterested. Psychoeducation provided on marijuana and alcohol use. Pt is receptive to restarting medication and therapy as he was involved in both in the past. Has appt with psychiatry at [facility] in [place] on 9/6. Pt advised to inform psychiatrist of request for therapy in addition to medication management.   Plan: Follow up with behavioral health clinician on : 12/13/20 Behavioral recommendations: Utilize deep breathing exercises, utilize meditation, and attend appt for  medication management Referral(s): Integrated Art gallery manager (In Clinic) and [facility]:  Housing "From scale of 1-10, how likely  are you to follow plan?": 10  Richard [name] C Colbi Schiltz, LCSW

## 2020-11-17 ENCOUNTER — Telehealth: Payer: Self-pay

## 2020-11-17 NOTE — Telephone Encounter (Signed)
Amy, I received this message from [facility] regarding the power chair order.      I reviewed the note dated 10/24/20 by Ricky Stabs, NP but patient's weight isn't included in the note. She also states referral to Physical Therapy for further evaluation and management but doesn't state for wheelchair evaluation. Can you please ask if she can addend and sign the note to refer for Power Wheelchair PT evaluation. Patient's weight will also need to be added to thenote or we will need a weight after 10/24/20 date.    If an addendum is not possible, we will need to schedule the patient for another face to face evaluation after  the PTMobility/Seating Evaluation is completed

## 2020-11-24 ENCOUNTER — Ambulatory Visit: Payer: Medicare HMO | Attending: Family Medicine

## 2020-11-24 ENCOUNTER — Other Ambulatory Visit: Payer: Self-pay

## 2020-11-24 DIAGNOSIS — M6281 Muscle weakness (generalized): Secondary | ICD-10-CM

## 2020-11-24 DIAGNOSIS — R2689 Other abnormalities of gait and mobility: Secondary | ICD-10-CM | POA: Diagnosis not present

## 2020-11-24 DIAGNOSIS — R2681 Unsteadiness on feet: Secondary | ICD-10-CM

## 2020-11-24 DIAGNOSIS — S98311S Complete traumatic amputation of right midfoot, sequela: Secondary | ICD-10-CM

## 2020-11-24 DIAGNOSIS — S98312S Complete traumatic amputation of left midfoot, sequela: Secondary | ICD-10-CM | POA: Insufficient documentation

## 2020-11-24 NOTE — Therapy (Signed)
[facility] [place], [place], [address] Phone: [phone]   Fax:  [phone]  Physical Therapy Wheelchair Evaluation  Patient Details  Name: Richard [name] MRN: [id number] Date of Birth: July 22, 1975 Referring Provider (PT): Durene Fruits, NP   Encounter Date: 11/24/2020   PT End of Session - 11/24/20 1517     Visit Number 1    PT Start Time 1000    PT Stop Time 1050    PT Time Calculation (min) 50 min    Equipment Utilized During Treatment Gait belt    Activity Tolerance Patient tolerated treatment well;Patient limited by pain    Behavior During Therapy WFL for tasks assessed/performed             Past Medical History:  Diagnosis Date   Acid reflux    Bipolar 1 disorder ([place])    Depression    OCD (obsessive compulsive disorder)    Schizo affective schizophrenia ([place])    Schizoaffective disorder ([place])     Past Surgical History:  Procedure Laterality Date   MOUTH SURGERY      There were no vitals filed for this visit.  Wheelchair evaluation was completed at today's evaluation.  Paperwork completed and provided to wheelchair vendor from [facility].  Face to face evaluation with MD to follow for further sign off for power mobility.  Copy of PT letter of medical necessity available in EPIC.     Subjective Assessment - 11/24/20 0956     Subjective B foot chopart/transmetatarsal  amputation April 2022 related to frostbite in [place] PT Assessment - 11/24/20 0956       Assessment   Medical Diagnosis Z76.89 (ICD-10-CM) - Encounter for power mobility device assessment. S/p B TMA    Referring Provider (PT) Durene Fruits, NP             Mobility/Seating Evaluation    PATIENT INFORMATION: Name: Richard [name] DOB: 07-07-1975  Sex: M Date seen: 11/24/2020 Time:   Address:  [place] [address] Physician: Camillia Herter, NP This  evaluation/justification form will serve as the LMN for the following suppliers: __________________________ Supplier: [employer] Contact Person: Luz Brazen, Wess Botts Phone:  [phone]   Seating Therapist: Sherlynn Stalls Phone:   [phone]   Phone: [phone]     Spouse/Parent/Caregiver name: [name]  Phone number: [phone] Insurance/Payer: [employer] / Medicare     Reason for Referral: Assessment for Powered Mobility s/p Bilateral Transmetatarsal amputations  Patient/Caregiver Goals: To be able to get farther than 10 steps, be able to get across all types of surfaces in home and community without pain and improved quality of life.   Patient was seen for face-to-face evaluation for new power wheelchair.  Also present was [employer] to discuss recommendations and wheelchair options.  Further paperwork was completed and sent to vendor.  Patient appears to qualify for power mobility device at this time per objective findings.   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 No additional future surgeries currently planned.      Height: '5\' 10"'  Weight: 145 lbs Explain recent changes or trends in weight: Reports gained weight over past few months  after being in hospital and with decreased mobility   History including Falls: Richard [name] is a 45 y.o. male who sustained injury to both feet secondary to frostbite while living homeless out in [place] that ultimately underwent bilateral TMA's January/february of 2022. He was reportedly in the hospital for approximately 7 to 10 days and then discharged to a hotel before being able to fly back to [place] to live with his father. He denies  having any falls since this time. He does state he gets occasional sharp shooting pains, phantom pain of bilateral feet. He also previously had slow healing wounds on the heels of his feet (right worse than left), which are currently healed but he does report getting increased pain with weightbearing. Per MD recommendations he was able to obtain a loaner power WC from [employer] post amputations and heel wounds to help with functional mobility. Patient's current activity level consists of trying to stay active and optimistic. He currently used the loaner power chair (for the past 2-3 months) as his primary method of mobility due to limited walking ability due to foot pain and limited balance, reporting only able to walk very short distances with a cane at home. He denies any falls.      HOME ENVIRONMENT: '[]' House  '[]' Condo/town home  '[x]' Apartment  '[]' Assisted Living    '[]' Lives Alone '[x]'  Lives with Others                                                                                          Hours with caregiver:   '[x]' Home is accessible to patient           Stairs      '[]' Yes '[x]'  No     Ramp '[x]' Yes '[]' No Comments:  1st floor apartment, able to get around with with current loaner power WC . Ramp to enter building. Able to get into bedroom, bathroom without issue in current WC  There is also carpet within the home, able to negotiate this well with power WC. He lives in this apartment with his father.     COMMUNITY ADL: TRANSPORTATION: '[]' Car    '[]' Van    '[x]' Public Transportation    '[]' Adapted w/c Lift    '[]' Ambulance    '[]' Other:       '[x]' Sits in wheelchair during transport  Employment/School: Disabled Specific requirements pertaining to mobility   Other: Unable to currently get power WC into a car. Will use a manual WC if needed for short distance if needs to get into a smaller vehicle, but he is unable to use this for functional longer distances due to hand pain and decreased endurance. Currently needs to  utilize health transport to get to and from all medical appointments otherwise.       FUNCTIONAL/SENSORY PROCESSING SKILLS:  Handedness:   '[x]' Right     '[]' Left    '[]' NA  Comments:    Functional Processing Skills for Wheeled Mobility '[x]' Processing Skills are adequate for safe wheelchair operation  Areas of concern than may interfere with safe operation of wheelchair Description of problem   '[]'  Attention to environment      '[]' Judgment      '[]'   Hearing  '[]'  Vision or visual processing      '[]' Motor Planning  '[]'  Fluctuations in Behavior  N/A   VERBAL COMMUNICATION: '[x]' WFL receptive '[x]'  WFL expressive '[x]' Understandable  '[]' Difficult to understand  '[]' non-communicative '[]'  Uses an augmented communication device  CURRENT SEATING / MOBILITY: Current Mobility Base:  '[x]' None '[]' Dependent '[]' Manual '[]' Scooter '[]' Power  Type of Control:     (Currently utilizing a loaner power WC from [employer] )  Manufacturer:  [employer] Size:  18 x 18 Age: loaner  Current Condition of Mobility Base:     Current Wheelchair components:    Describe posture in present seating system:  midline trunk posture in all planes for head, trunk and pelvis.      SENSATION and SKIN ISSUES: Sensation '[]' Intact  '[x]' Impaired '[]' Absent  Level of sensation: mildly diminished sensation to light touch near heels where scar tissue/callousing is present from previous wounds.  Pressure Relief: Able to perform effective pressure relief:    '[x]' Yes  '[]'  No Method: weight shifting A-P and M-L, able to press up, able to complete sit to sit transfers or sit to stand with UE support during transfer and to stabilize in standing.  If not, Why?:   Skin Issues/Skin Integrity Current Skin Issues  '[]' Yes '[x]' No '[x]' Intact '[]'  Red area'[]'  Open Area  '[x]' Scar Tissue '[]' At risk from prolonged sitting Where  B heels. Well healing scars at bilateral amputation sites for TMA. increased scar tissue with decrease tissue mobility at right posterior heel and left inferior heel on sole  of foor.    History of Skin Issues  '[x]' Yes '[]' No Where  B heels When  previous pressure wounds   Hx of skin flap surgeries  '[]' Yes '[x]' No Where   When    Limited sitting tolerance '[]' Yes '[x]' No Hours spent sitting in wheelchair daily: > 7-8 hours intermittently for mobility, does transfer in and out of chair and does not stay in chair for prolonged time at home.   Complaint of Pain:  Please describe: B heel pain (right worse than left) up to 4-5/10 with weight bearing in standing and short distance ambulation with SPC. He also does get phantom pain bilaterally that comes and goes , sharp pains coming from toes that are no longer there, pain increasing to 8-9/10 when this occurs.     Swelling/Edema: None present   ADL STATUS (in reference to wheelchair use):  Indep Assist Unable Indep with Equip Not assessed Comments  Dressing '[]'  '[]'  '[]'  '[x]'  '[]'  Pt completes all dressing for upper and lower body in sitting or laying down.Does not have the sufficient balance required for dressing upper body due to difficulty controlling weight shifts on feet, and unable to complete SLS for dressing lower body in standing.    Eating '[x]'  '[]'  '[]'  '[]'  '[]'    Toileting '[]'  '[]'  '[]'  '[x]'  '[]'  Pt requires increased time along with UE support to assist him in pulling himself to standing and control self to sit in order to complete toileting.   Bathing '[]'  '[]'  '[]'  '[x]'  '[]'  Pt is currently only able to bathe utilizing transfers into/out of shower in sitting on shower chair. Increased time required 2/2 not having the standing balance required to shower in standing.   Grooming/Hygiene '[]'  '[]'  '[]'  '[x]'  '[]'  Pt requires extended time for grooming/hygiene   Meal Prep '[]'  '[]'  '[]'  '[x]'  '[]'  Able to complete in sitting  IADLS '[]'  '[]'  '[]'  '[x]'  '[]'    Bowel Management: '[x]' Continent  '[]' Incontinent  '[]' Accidents Comments:    Bladder Management: '[x]' Continent  '[]'   Incontinent  '[]' Accidents Comments:       WHEELCHAIR SKILLS: Manual w/c Propulsion: '[]' UE or LE strength and  endurance sufficient to participate in ADLs using manual wheelchair Arm : '[]' left '[]' right   '[]' Both      Distance:  Foot:  '[]' left '[]' right   '[]' Both  Operate Scooter: '[]'  Strength, hand grip, balance and transfer appropriate for use '[]' Living environment is accessible for use of scooter  Operate Power w/c:  '[x]'  Std. Joystick   '[]'  Alternative Controls Indep '[x]'  Assist '[]'  Dependent/unable '[]'  N/A '[]'   '[x]' Safe          '[x]'  Functional      Distance:   Bed confined without wheelchair '[]'  Yes '[]'  No   STRENGTH/RANGE OF MOTION:   Range of Motion Strength  Shoulder Full and symmetrical 4+/5 flexion and ABD bilateral  Elbow  4+/5 bilaterally for flexion and extension  Wrist/Hand full AROM Grip: L: 70 lb. R: 80 lb  Hip full and symmetrical flexion bilaterally B hip 4+/5  Knee  B knee flexion and extension 4/5  Ankle R ankle 0-5 DF with knee extended (DFKE), L ankle 0-10 DFKE. Increased pain on the right heel more than left with puckering of scar tissue behind right heel. Currently in approval process of prosthetics  3-/5 bilateral ankle DF/PF    MOBILITY/BALANCE:  '[]'  Patient is totally dependent for mobility      Balance Transfers Ambulation  Sitting Balance: Standing Balance: '[]'  Independent '[]'  Independent/Modified Independent  '[x]'  WFL     '[]'  WFL '[]'  Supervision '[]'  Supervision  '[]'  Uses UE for balance  '[]'  Supervision '[x]'  Min Assist with  1 -2 UE support to push up and to assist with stability '[]'  Ambulates with Assist      '[]'  Min Assist '[x]'  Min assist - for static balance, requires UE support '[]'  Mod Assist - mod to max A if attempted without external support '[]'  Ambulates with Device:      '[]'  RW  '[]'  StW  '[]'  Cane  '[x]'  Short distances only  '[]'  Mod Assist '[]'  Mod assist - Mod A to max A for dynamic balance in standing  '[]'  Max assist   '[]'  Max Assist '[]'  Max assist '[]'  Dependent '[]'  Indep. Short Distance Only  '[]'  Unable '[]'  Unable '[]'  Lift / Sling Required Distance (in feet)     '[]'  Sliding board '[]'  Unable to  Ambulate (see explanation below)  Cardio Status:  '[x]' Intact  '[]'  Impaired   '[]'  NA       Respiratory Status:  '[x]' Intact   '[]' Impaired   '[]' NA       Orthotics/Prosthetics: in process for getting approval for prosthetics for B TMA   Comments (Address manual vs power w/c vs scooter): SPC or some kind of UE support is needed for getting into standing and stabilizing in standing. Even with UE support, he does demonstrate moderate sway with LOB forward frequent in static standing with wide BOS. TUG was assessed with SPC and completed in 17 seconds with SPC, and close supervision provided, gait with widened base of support, diminished stance time and SLS abilities due to B TMA, unstable on level surface with ambulation. He also reports that this is worse if he tries to negotiate elevations or uneven surfaces because of decreased BOS surface area.  Pt has previously tried using a SPC but is only able to use this unsteadily and for very short distances, limited by pain, balance given altered base of support post amputations, and endurance.  He has trialed a rolling walker previously with continued limitations in tolerated distance 2/2 foot pain. Pt has also tried utilizing a manual WC but can only use this for a short duration due to limitations in hand strength, complaints of some hand pain, and decreased endurance. He does state he only utilizes a manual WC if he needs to get into a car that will not fit the loaner power WC. The power WC loaned from [facility] has been most successful and efficient in maintaining patient's safety and functional independence in and out of home for negotiation of varied environments in a timely manner.          Anterior / Posterior Obliquity Rotation-Pelvis   PELVIS    '[x]'  '[]'  '[]'   Neutral Posterior Anterior  '[x]'  '[]'  '[]'   WFL Rt elev Lt elev  '[x]'  '[]'  '[]'   WFL Right Left                      Anterior    Anterior     '[]'  Fixed '[]'  Other '[]'  Partly Flexible '[]'  Flexible   '[]'  Fixed '[]'   Other '[]'  Partly Flexible  '[]'  Flexible  '[]'  Fixed '[]'  Other '[]'  Partly Flexible  '[]'  Flexible   TRUNK  '[]'  '[x]'  '[]'   WFL  Thoracic  Lumbar  Kyphosis Lordosis  '[x]'  '[]'  '[]'   WFL Convex Convex  Right Left '[]' c-curve '[]' s-curve '[]' multiple  '[x]'  Neutral '[]'  Left-anterior '[]'  Right-anterior     '[]'  Fixed '[x]'  Flexible '[]'  Partly Flexible '[]'  Other  '[]'  Fixed '[]'  Flexible '[]'  Partly Flexible '[]'  Other  '[]'  Fixed             '[]'  Flexible '[]'  Partly Flexible '[]'  Other    Position Windswept    HIPS          '[x]'            '[]'               '[]'    Neutral       Abduct        ADduct         '[x]'           '[]'            '[]'   Neutral Right           Left      '[]'  Fixed '[]'  Subluxed '[]'  Partly Flexible '[]'  Dislocated '[x]'  Flexible  '[]'  Fixed '[]'  Other '[]'  Partly Flexible  '[]'  Flexible                 Foot Positioning Knee Positioning  . Not fixed in plantarflexion but rest in plantarflexion bilaterally    '[x]'  WFL  '[]' Lt '[]' Rt '[x]'  WFL  '[]' Lt '[]' Rt    KNEES ROM concerns: ROM concerns:    & Dorsi-Flexed '[]' Lt '[]' Rt     FEET Plantar Flexed '[x]' Lt '[x]' Rt      Inversion                 '[]' Lt '[]' Rt      Eversion                 '[]' Lt '[]' Rt     HEAD '[x]'  Functional '[x]'  Good Head Control    & '[]'  Flexed         '[]'  Extended '[]'  Adequate Head Control    NECK '[]'  Rotated  Lt  '[]'  Lat Flexed Lt '[]'  Rotated  Rt '[]'  Lat Flexed Rt '[]'  Limited Head Control     '[]'   Cervical Hyperextension '[]'  Absent  Head Control     SHOULDERS ELBOWS WRIST& HAND       Left     Right    Left     Right    Left     Right   U/E '[x]' Functional           '[x]' Functional   '[]' Fisting             '[]' Fisting      '[]' elev   '[]' dep      '[]' elev   '[]' dep       '[]' pro -'[]' retract     '[]' pro  '[]' retract '[]' subluxed             '[]' subluxed         Goals for Wheelchair Mobility  '[x]'  Independence with mobility in the home with motor related ADLs (MRADLs)  '[x]'  Independence with MRADLs in the community '[]'  Provide dependent mobility  '[]'  Provide recline     '[]' Provide tilt   Goals for Seating system '[]'   Optimize pressure distribution  '[x]'  Provide support needed to facilitate function or safety '[]'  Provide corrective forces to assist with maintaining or improving posture '[]'  Accommodate client's posture:   current seated postures and positions are not flexible or will not tolerate corrective forces '[]'  Client to be independent with relieving pressure in the wheelchair '[]' Enhance physiological function such as breathing, swallowing, digestion  Simulation ideas/Equipment trials: State why other equipment was unsuccessful:  Win   MOBILITY BASE RECOMMENDATIONS and JUSTIFICATION: [place]  Manufacturer: Model:    Size: Width Seat Depth  '[x]' provide transport from point A to B      '[x]' promote Indep mobility  '[x]' is not a safe, functional ambulator '[x]' walker or cane inadequate '[]' non-standard width/depth necessary to accommodate anatomical measurement '[]'    '[]' Manual Mobility Base '[]' non-functional ambulator    '[]' Scooter/POV  '[]' can safely operate  '[]' can safely transfer   '[]' has adequate trunk stability  '[]' cannot functionally propel manual w/c  '[x]' Power Mobility Base  '[]' non-ambulatory  '[x]' cannot functionally propel manual wheelchair  '[x]'  cannot functionally and safely operate scooter/POV '[x]' can safely operate and willing to  '[]' Stroller Base '[]' infant/child  '[]' unable to propel manual wheelchair '[]' allows for growth '[]' non-functional ambulator '[]' non-functional UE '[]' Indep mobility is not a goal at this time  '[]' Tilt  '[]' Forward '[]' Backward '[]' Powered tilt  '[]' Manual tilt  '[]' change position against gravitational force on head and shoulders  '[]' change position for pressure relief/cannot weight shift '[]' transfers  '[]' management of tone '[]' rest periods '[]' control edema '[]' facilitate postural control  '[]'    '[]' Recline  '[]' Power recline on power base '[]' Manual recline on manual base  '[]' accommodate femur to back angle  '[]' bring to full recline for ADL care  '[]' change position for pressure  relief/cannot weight shift '[]' rest periods '[]' repositioning for transfers or clothing/diaper /catheter changes '[]' head positioning  '[]' Lighter weight required '[]' self- propulsion  '[]' lifting '[]'    '[]' Heavy Duty required '[]' user weight greater than 250# '[]' extreme tone/ over active movement '[]' broken frame on previous chair '[]'    '[]'  Back  '[]'  Angle Adjustable '[]'  Custom molded  '[]' postural control '[]' control of tone/spasticity '[]' accommodation of range of motion '[]' UE functional control '[]' accommodation for seating system '[]'   '[]' provide lateral trunk support '[]' accommodate deformity '[]' provide posterior trunk support '[]' provide lumbar/sacral support '[]' support trunk in midline '[]' Pressure relief over spinal processes  '[x]'  Seat Cushion  '[]' impaired sensation  '[]' decubitus ulcers present '[]' history of pressure ulceration '[]' prevent pelvic extension '[x]' low maintenance  '[]' stabilize pelvis  '[]' accommodate obliquity '[]' accommodate multiple deformity '[]' neutralize lower extremity position '[]' increase pressure distribution '[]'    '[]'   Pelvic/thigh support  '[]'  Lateral thigh guide '[]'  Distal medial pad  '[]'  Distal lateral pad '[]'  pelvis in neutral '[]' accommodate pelvis '[]'  position upper legs '[]'  alignment '[]'  accommodate ROM '[]'  decr adduction '[]' accommodate tone '[]' removable for transfers '[]' decr abduction  '[]'  Lateral trunk Supports '[]'  Lt     '[]'  Rt '[]' decrease lateral trunk leaning '[]' control tone '[]' contour for increased contact '[]' safety  '[]' accommodate asymmetry '[]'    '[]'  Mounting hardware  '[]' lateral trunk supports  '[]' back   '[]' seat '[]' headrest      '[]'  thigh support '[]' fixed   '[]' swing away '[]' attach seat platform/cushion to w/c frame '[]' attach back cushion to w/c frame '[]' mount postural supports '[]' mount headrest  '[]' swing medial thigh support away '[]' swing lateral supports away for transfers  '[]'     Armrests  '[]' fixed '[x]' adjustable height '[]' removable   '[]' swing away  '[x]' flip back   '[]' reclining '[x]' full length pads '[]' desk     '[]' pads tubular  '[x]' provide support with elbow at 90   '[]' provide support for w/c tray '[x]' change of height/angles for variable activities '[]' remove for transfers '[x]' allow to come closer to table top '[]' remove for access to tables '[x]'    Hangers/ Leg rests  '[]' 60 '[]' 70 '[]' 90 '[]' elevating '[]' heavy duty  '[]' articulating '[]' fixed '[]' lift off '[]' swing away     '[]' power '[]' provide LE support  '[]' accommodate to hamstring tightness '[]' elevate legs during recline   '[]' provide change in position for Legs '[]' Maintain placement of feet on footplate '[]' durability '[]' enable transfers '[]' decrease edema '[]' Accommodate lower leg length '[]'    Foot support Footplate    '[]' Lt  '[]'  Rt  '[x]'  Center mount '[x]' flip up     '[x]' depth/angle adjustable '[]' Amputee adapter    '[]'  Lt     '[]'  Rt '[x]' provide foot support '[x]' accommodate to ankle ROM '[x]' transfers '[x]' Provide support for residual extremity '[]'  allow foot to go under wheelchair base '[]'  decrease tone  '[x]'    '[]'  Ankle strap/heel loops '[]' support foot on foot support '[]' decrease extraneous movement '[]' provide input to heel  '[]' protect foot  Tires: '[]' pneumatic  '[x]' flat free inserts  '[]' solid  '[]' decrease maintenance  '[]' prevent frequent flats '[]' increase shock absorbency '[]' decrease pain from road shock '[]' decrease spasms from road shock '[]'    '[]'  Headrest  '[]' provide posterior head support '[]' provide posterior neck support '[]' provide lateral head support '[]' provide anterior head support '[]' support during tilt and recline '[]' improve feeding   '[]' improve respiration '[]' placement of switches '[]' safety  '[]' accommodate ROM  '[]' accommodate tone '[]' improve visual orientation  '[]'  Anterior chest strap '[]'  Vest '[]'  Shoulder retractors  '[]' decrease forward movement of shoulder '[]' accommodation of TLSO '[]' decrease forward movement of trunk '[]' decrease shoulder elevation '[]' added abdominal support '[]' alignment '[]' assistance with shoulder control  '[]'    Pelvic Positioner '[x]' Belt '[]' SubASIS bar '[]' Dual Pull  '[]' stabilize tone '[x]' decrease falling out of chair/ **will not Decr potential for sliding due to pelvic tilting '[]' prevent excessive rotation '[]' pad for protection over boney prominence '[]' prominence comfort '[]' special pull angle to control rotation '[]'    Upper Extremity Support '[]' L   '[]'  R '[]' Arm trough    '[]' hand support '[]'  tray       '[]' full tray '[]' swivel mount '[]' decrease edema      '[]' decrease subluxation   '[]' control tone   '[]' placement for AAC/Computer/EADL '[]' decrease gravitational pull on shoulders '[]' provide midline positioning '[]' provide support to increase UE function '[]' provide hand support in natural position '[]' provide work surface   POWER WHEELCHAIR CONTROLS  '[x]' Proportional  '[]' Non-Proportional Type  '[]' Left  '[x]' Right '[x]' provides access for controlling wheelchair   '[]' lacks motor control to operate proportional drive control '[]' unable to understand proportional controls  Actuator  Control Module  '[]' Single  '[]' Multiple   '[]' Allow the client to operate the power seat function(s) through the joystick control   '[]' Safety Reset Switches '[]' Used to change modes and stop the wheelchair when driving in latch mode    '[]' Upgraded Electronics   '[]' programming for accurate control '[]' progressive Disease/changing condition '[]' non-proportional drive control needed '[]' Needed in order to operate power seat functions through joystick control   '[]' Display box '[]' Allows user to see in which mode and drive the wheelchair is set  '[]' necessary for alternate controls    '[]' Digital interface electronics '[]' Allows w/c to operate when using alternative drive controls  '[]' ASL Head Array '[]' Allows client to operate wheelchair  through switches placed in tri-panel headrest  '[]' Sip and puff with tubing kit '[]' needed to operate sip and puff drive controls  '[]' Upgraded tracking electronics '[]' increase safety when driving '[]' correct tracking when on uneven surfaces  '[x]' Mount for switches or joystick '[]' Attaches switches to w/c  '[x]' Swing  away for access or transfers '[]' midline for optimal placement '[]' provides for consistent access  '[]' Attendant controlled joystick plus mount '[]' safety '[]' long distance driving '[]' operation of seat functions '[]' compliance with transportation regulations '[]'     Rear wheel placement/Axle adjustability '[]' None '[]' semi adjustable '[]' fully adjustable  '[]' improved UE access to wheels '[]' improved stability '[]' changing angle in space for improvement of postural stability '[]' 1-arm drive access '[]' amputee pad placement '[]'    Wheel rims/ hand rims  '[]' metal  '[]' plastic coated '[]' oblique projections '[]' vertical projections '[]' Provide ability to propel manual wheelchair  '[]'  Increase self-propulsion with hand weakness/decreased grasp  Push handles '[]' extended  '[]' angle adjustable  '[]' standard '[]' caregiver access '[]' caregiver assist '[]' allows "hooking" to enable increased ability to perform ADLs or maintain balance  One armed device  '[]' Lt   '[]' Rt '[]' enable propulsion of manual wheelchair with one arm   '[]'      Brake/wheel lock extension '[]'  Lt   '[]'  Rt '[]' increase indep in applying wheel locks   '[]' Side guards '[]' prevent clothing getting caught in wheel or becoming soiled '[]'  prevent skin tears/abrasions  Battery:  '[x]' to power wheelchair   Other:     The above equipment has a life- long use expectancy. Growth and changes in medical and/or functional conditions would be the exceptions. This is to certify that the therapist has no financial relationship with durable medical provider or manufacturer. The therapist will not receive remuneration of any kind for the equipment recommended in this evaluation.   Patient has mobility limitation that significantly impairs safe, timely participation in one or more mobility related ADL's.  (bathing, toileting, feeding, dressing, grooming, moving from room to room)                                                             '[x]'  Yes '[]'  No Will mobility device sufficiently improve ability to participate  and/or be aided in participation of MRADL's?         '[x]'  Yes '[]'  No Can limitation be compensated for with use of a cane or walker?                                                                                '[]'   Yes '[x]'  No Does patient or caregiver demonstrate ability/potential ability & willingness to safely use the mobility device?   '[x]'  Yes '[]'  No Does patient's home environment support use of recommended mobility device?                                                    '[x]'  Yes '[]'  No Does patient have sufficient upper extremity function necessary to functionally propel a manual wheelchair?    '[]'  Yes '[x]'  No Does patient have sufficient strength and trunk stability to safely operate a POV (scooter)?                                  '[]'  Yes '[x]'  No Does patient need additional features/benefits provided by a power wheelchair for MRADL's in the home?       '[x]'  Yes '[]'  No Does the patient demonstrate the ability to safely use a power wheelchair?                                                              '[x]'  Yes '[]'  No  Therapist Name Printed: Hall Busing, PT, DPT Date:   Therapist's Signature:   Date: 11/24/20  Supplier's Name Printed: [employer] Date: 11/24/20  Supplier's Signature:   Date:  Patient/Caregiver Signature:   Date:     This is to certify that I have read this evaluation and do agree with the content within:   Physician's Name Printed: Camillia Herter, NP  58 Signature:  Date:     This is to certify that I, the above signed therapist have the following affiliations: '[]'  This DME provider '[]'  Manufacturer of recommended equipment '[]'  Patient's long term care facility '[x]'  None of the above                         PT Education - 11/24/20 1517     Education Details Process for obtaining power WC and next steps for follow up with [employer]    Person(s) Educated Patient    Methods Explanation    Comprehension Verbalized understanding                          Plan - 11/24/20 1517     Clinical Impression Statement Richard [name] is a 45 y.o. 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 He denies having any falls since this time. He does state he gets occasional sharp shooting pains, phantom pain of bilateral feet. He also previously had slow healing wounds on the heels of his feet (right worse than left), which are currently healed but he does report getting increased pain with weightbearing. He currently presents with impaired BLE strength, decreased standing balance, and impaired ability to ambulate. He has been utilizing a Risk manager WC from [employer] for the past 2-3 months post amputations and heel wounds to help with functional mobility per MD recommendations given  foot pain and limtied balance. Patient's current activity level consists of trying to stay active and optimistic.   TUG was assessed with SPC and completed in 17 seconds with close supervision provided and gait with widened base of support, diminished stance time and SLS abilities due to B TMA, unstable on level surface. He also reports that this is worse if he tries to negotiate elevations or uneven surfaces because of decreased BOS surface area.  A power WC is currently most appropriate for primary mobility at this time to allow for energy efficient, safe, and functionally independent mobility in and out of home for negotiation of varied environments in a timely manner.    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   Hall Busing, PT, DPT 11/24/2020, 3:37 PM  [address] [place], [place], [address] Phone: [phone]   Fax:  [phone]  Name: Elmer Merwin MRN: [id number] Date of Birth: [birth year]

## 2020-11-26 NOTE — Telephone Encounter (Signed)
Erskine Squibb,   As requested per Zenia Resides at [facility] the encounter on 10/24/2020 with me has an updated addendum with the following requested information:  - Patient's weight. - Statement specifically stating that Physical Therapy referral for evaluation and management is needed for wheelchair evaluation.   Ricky Stabs, NP  Family Medicine [facility] [phone]

## 2020-12-06 ENCOUNTER — Encounter: Payer: Self-pay | Admitting: Orthopedic Surgery

## 2020-12-06 NOTE — Progress Notes (Signed)
   Office Visit Note   Patient: Malcome Ambrocio           Date of Birth: Apr 02, 1976           MRN: [id number] Visit Date: 11/10/2020              Requested by: Arvilla Market, MD [address] [place],  [place] [address] PCP: Arvilla Market, MD  Chief Complaint  Patient presents with   Right Foot - Follow-up   Left Foot - Follow-up      HPI: Patient is a 45 year old gentleman who presents in follow-up for both feet with a midfoot amputation bilaterally.  Patient states he is doing well is using Neurontin and has pain with weightbearing.  Assessment & Plan: Visit Diagnoses:  1. History of Chopart amputation (HCC)     Plan: Patient is given a prescription for [employer] for shoes orthotics inserts.  Follow-Up Instructions: Return if symptoms worsen or fail to improve.   Ortho Exam  Patient is alert, oriented, no adenopathy, well-dressed, normal affect, normal respiratory effort. Examination patient ambulates in a motorized chair the bilateral midfoot amputations are well-healed.  The heel ulcers have also healed as well.  He is currently in sneakers.  Imaging: No results found. No images are attached to the encounter.  Labs: No results found for: HGBA1C, ESRSEDRATE, CRP, LABURIC, REPTSTATUS, GRAMSTAIN, CULT, LABORGA   Lab Results  Component Value Date   ALBUMIN 4.7 12/18/2015    No results found for: MG No results found for: VD25OH  No results found for: PREALBUMIN CBC EXTENDED Latest Ref Rng & Units 12/18/2015  WBC 4.0 - 10.5 K/uL 7.2  RBC 4.22 - 5.81 MIL/uL 4.94  HGB 13.0 - 17.0 g/dL 54.6  HCT 50.3 - 54.6 % 44.5  PLT 150 - 400 K/uL 357  NEUTROABS 1.7 - 7.7 K/uL 3.7  LYMPHSABS 0.7 - 4.0 K/uL 2.2     There is no height or weight on file to calculate BMI.  Orders:  No orders of the defined types were placed in this encounter.  No orders of the defined types were placed in this encounter.    Procedures: No procedures  performed  Clinical Data: No additional findings.  ROS:  All other systems negative, except as noted in the HPI. Review of Systems  Objective: Vital Signs: There were no vitals taken for this visit.  Specialty Comments:  No specialty comments available.  PMFS History: There are no problems to display for this patient.  Past Medical History:  Diagnosis Date   Acid reflux    Bipolar 1 disorder (HCC)    Depression    OCD (obsessive compulsive disorder)    Schizo affective schizophrenia (HCC)    Schizoaffective disorder (HCC)     History reviewed. No pertinent family history.  Past Surgical History:  Procedure Laterality Date   MOUTH SURGERY     Social History   Occupational History   Not on file  Tobacco Use   Smoking status: Every Day    Packs/day: 1.00    Types: Cigarettes   Smokeless tobacco: Never  Substance and Sexual Activity   Alcohol use: Yes    Comment: 2 beers per day    Drug use: Yes    Types: Marijuana   Sexual activity: Not on file

## 2020-12-13 ENCOUNTER — Ambulatory Visit: Payer: Medicare HMO | Admitting: Clinical

## 2020-12-20 ENCOUNTER — Ambulatory Visit (INDEPENDENT_AMBULATORY_CARE_PROVIDER_SITE_OTHER): Payer: Medicare HMO | Admitting: Psychiatry

## 2020-12-20 ENCOUNTER — Encounter (HOSPITAL_COMMUNITY): Payer: Self-pay | Admitting: Psychiatry

## 2020-12-20 VITALS — BP 124/78 | Temp 98.6°F | Ht 70.0 in | Wt 145.0 lb

## 2020-12-20 DIAGNOSIS — Z7289 Other problems related to lifestyle: Secondary | ICD-10-CM | POA: Diagnosis not present

## 2020-12-20 DIAGNOSIS — F1220 Cannabis dependence, uncomplicated: Secondary | ICD-10-CM

## 2020-12-20 DIAGNOSIS — Z789 Other specified health status: Secondary | ICD-10-CM

## 2020-12-20 DIAGNOSIS — F251 Schizoaffective disorder, depressive type: Secondary | ICD-10-CM

## 2020-12-20 DIAGNOSIS — R69 Illness, unspecified: Secondary | ICD-10-CM | POA: Diagnosis not present

## 2020-12-20 DIAGNOSIS — F1090 Alcohol use, unspecified, uncomplicated: Secondary | ICD-10-CM

## 2020-12-20 MED ORDER — ARIPIPRAZOLE 5 MG PO TABS: 5.0000 mg | ORAL_TABLET | Freq: Every day | ORAL | 0 refills | Status: DC

## 2020-12-20 MED ORDER — BUSPIRONE HCL 7.5 MG PO TABS: 7.5000 mg | ORAL_TABLET | Freq: Three times a day (TID) | ORAL | 0 refills | Status: DC

## 2020-12-20 NOTE — Progress Notes (Signed)
Psychiatric Initial Adult Assessment   Patient Identification: Richard [name] MRN:  [id number] Date of Evaluation:  12/20/2020 Referral Source: self, pcp Chief Complaint:  establish care, schizoaffective disorder, anxiety Visit Diagnosis:    ICD-10-CM   1. Schizoaffective disorder, depressive type (HCC)  F25.1     2. Alcohol use  Z72.89     3. Tetrahydrocannabinol (THC) use disorder, moderate, dependence (HCC)  F12.20       History of Present Illness: Patient is a 45 years old currently single Caucasian male currently living with his dad diagnosed with schizoaffective disorder and anxiety disorder he has moved from [place] he had a frostbite over there and relocated here he has moved from state to state states that he has been on different medications in the past he is on disability for schizoaffective disorder for the last 10 years  Last medication was Seroquel says it makes him dizzy.  He has been on lithium and it makes him shake he is not sure about the list of all his medication but for the last few months he has been off medication he wants to get back on medication for depression and also for this.  If he is not endorsing any significant paranoia but does endorse hallucinations which he cannot understand and there are whispers.  He does acknowledge using marijuana daily and alcohol 2 or 3 beers.  He has a history of substance use including benzodiazepines in the past he remains somewhat reluctant to stop marijuana.  Currently does have some support of the family members he was homeless in [place] and had a frostbite and his toes were amputated he wanted to move away now from a cold weather and moved over here He does endorse getting down depressed at x4 days with decreased energy decreased desire to do things and decreased interest in things the feeling of despair   He says he is allergic to Haldol he has been on different medication noncompliant in the past with multiple hospitalization  in the past for possible exacerbation of schizoaffective and anxiety disorder last admission was nearly 7 to 8 years ago  He does endorse anxiety get panic attack around people mostly stays at home.  He worries about things about his health about future and also some past traumatic memories when he was growing up he does not remember much and also from frostbite and the surgery he still endorses anxiety related to that of the amputation  Does not endorse any clear manic symptoms does endorse history of hearing voices starting at young age and on disability for schizoaffective disorder  Aggravating factors past trauma, frostbite cannot walk straight because of his amputation Modifying factors dad, family  Duration since young age Past Psychiatric History: schizoaffective disorder, anxiety, alcohol use  Previous Psychotropic Medications: Yes   Substance Abuse History in the last 12 months:  Yes.    Consequences of Substance Abuse: Discussed effects of THC , alacohol to depression, paranoia and jduejmet  Past Medical History:  Past Medical History:  Diagnosis Date   Acid reflux    Bipolar 1 disorder (HCC)    Depression    OCD (obsessive compulsive disorder)    Schizo affective schizophrenia (HCC)    Schizoaffective disorder (HCC)     Past Surgical History:  Procedure Laterality Date   MOUTH SURGERY      Family Psychiatric History: not sure  Family History: No family history on file.  Social History:   Social History   Socioeconomic History  Marital status: Single    Spouse name: Not on file   Number of children: Not on file   Years of education: Not on file   Highest education level: Not on file  Occupational History   Not on file  Tobacco Use   Smoking status: Every Day    Packs/day: 1.00    Types: Cigarettes   Smokeless tobacco: Never  Vaping Use   Vaping Use: Never used  Substance and Sexual Activity   Alcohol use: Yes    Comment: 2 beers per day    Drug  use: Yes    Types: Marijuana   Sexual activity: Not on file  Other Topics Concern   Not on file  Social History Narrative   ** Merged History Encounter **       Social Determinants of Health   Financial Resource Strain: Not on file  Food Insecurity: Not on file  Transportation Needs: Not on file  Physical Activity: Not on file  Stress: Not on file  Social Connections: Not on file    Additional Social History: grow up with mom. Left school around age 33. Have been diagnosed with schizoaffective disorder and is on disability for 10 years or more  Allergies:   Allergies  Allergen Reactions   Haldol [Haloperidol] Other (See Comments)    Paralysis    Haloperidol And Related     Could not move( paralyzation)    Metabolic Disorder Labs: No results found for: HGBA1C, MPG No results found for: PROLACTIN No results found for: CHOL, TRIG, HDL, CHOLHDL, VLDL, LDLCALC No results found for: TSH  Therapeutic Level Labs: No results found for: LITHIUM No results found for: CBMZ No results found for: VALPROATE  Current Medications: Current Outpatient Medications  Medication Sig Dispense Refill   ARIPiprazole (ABILIFY) 5 MG tablet Take 1 tablet (5 mg total) by mouth daily. 30 tablet 0   busPIRone (BUSPAR) 7.5 MG tablet Take 1 tablet (7.5 mg total) by mouth 3 (three) times daily. 30 tablet 0   No current facility-administered medications for this visit.     Psychiatric Specialty Exam: Review of Systems  Cardiovascular:  Negative for chest pain.  Psychiatric/Behavioral:  Positive for hallucinations. Negative for agitation and self-injury. The patient is nervous/anxious.    Blood pressure 124/78, temperature 98.6 F (37 C), height 5\' 10"  (1.778 m), weight 145 lb (65.8 kg).Body mass index is 20.81 kg/m.  General Appearance: Casual  Eye Contact:  Fair  Speech:  Normal Rate  Volume:  Decreased  Mood:  Euthymic  Affect:  Constricted  Thought Process:  Goal Directed   Orientation:  Full (Time, Place, and Person)  Thought Content:  Rumination  Suicidal Thoughts:  No  Homicidal Thoughts:  No  Memory:  Immediate;   Fair  Judgement:  Poor  Insight:  Shallow  Psychomotor Activity:  Decreased  Concentration:  Concentration: Fair  Recall:  of Knowledge:Fair  Language: Fair  Akathisia:   no involuntary movements  Handed:    AIMS (if indicated):  no involuntary movements  Assets:  Desire for Improvement  ADL's:  Intact  Cognition: WNL  Sleep:  Fair   Screenings: GAD-7    Flowsheet Row Integrated Behavioral Health from 10/18/2020 in Primary Care at [facility] Telemedicine from 09/14/2020 in Primary Care at [facility]  Total GAD-7 Score 19 21      PHQ2-9    Flowsheet Row Office Visit from 12/20/2020 in [facility] AT [facility] Office Visit  from 10/24/2020 in Primary Care at [facility] from 10/18/2020 in Primary Care at [facility] from 09/14/2020 in Primary Care at [facility] Total Score 1 0 2 6  PHQ-9 Total Score -- 0 14 18      Flowsheet Row Office Visit from 12/20/2020 in BEHAVIORAL HEALTH OUTPATIENT CENTER AT [facility] [place] [facility]  C-SSRS RISK CATEGORY No Risk       Assessment and Plan: as follows Schizoaffective disorder depressed type; his psychotic symptom includes some whispers but not commanding.  He has been noncompliant medication so we discussed in detail about compliance we will start Abilify 5 mg small dose to help with the depression and schizoaffective disorder  Generalized anxiety with panic attacks; start BuSpar 7.5 mg can increase if needed  Continue therapy for possible anxiety symptoms related to past trauma events and PTSD; has been on different medication eluding Zoloft but for 1 reason or the other was stopped so we will consider therapy continue and add BuSpar for now  Discussed in detail to avoid drugs and alcohol says he will cut  it down and stop using marijuana as well has history of substance dependence in the past and understands the fact that it can exacerbate paranoia hallucinations impaired judgment and depression   Follow-up in 4 weeks or earlier if needed medications were reviewed questions were addressed safety discussed not suicidal  Total time spent face to face along with review of chart and documentation 45 to 50 minutes   Thresa Ross, MD 9/6/202211:44 AM

## 2021-01-04 ENCOUNTER — Encounter: Payer: Self-pay | Admitting: Emergency Medicine

## 2021-01-04 ENCOUNTER — Other Ambulatory Visit: Payer: Self-pay

## 2021-01-04 ENCOUNTER — Ambulatory Visit
Admission: EM | Admit: 2021-01-04 | Discharge: 2021-01-04 | Disposition: A | Discharge status: Home / Self Care | Payer: Medicare HMO | Attending: Physician Assistant | Admitting: Physician Assistant

## 2021-01-04 DIAGNOSIS — B356 Tinea cruris: Secondary | ICD-10-CM | POA: Diagnosis not present

## 2021-01-04 MED ORDER — KETOCONAZOLE 2 % EX CREA: 1.0000 "application " | TOPICAL_CREAM | Freq: Every day | CUTANEOUS | 0 refills | Status: AC

## 2021-01-04 NOTE — ED Triage Notes (Signed)
Rash on bilateral upper thighs and genital area x 1 month. C/o painful and itching. Has not tried anything to help.

## 2021-01-04 NOTE — Discharge Instructions (Addendum)
Apply cream as prescribed. Follow up if no gradual improvement or if symptoms worsen in any way.

## 2021-01-04 NOTE — ED Provider Notes (Signed)
[facility]    CSN: [id number] Arrival date & time: 01/04/21  1051      History   Chief Complaint Chief Complaint  Patient presents with   Rash    HPI Richard [name] is a 45 y.o. male.   Patient here today for evaluation of an itchy rash to his groin area bilaterally that has been present for about a month and is not improving.  He reports he has tried some topical aloe with mild improvement of symptoms but no resolution.  He has not had fever or chills.  He denies any dysuria.  He has not had any penile discharge and has no concerns for STD.   Rash Associated symptoms: no fever and no shortness of breath    Past Medical History:  Diagnosis Date   Acid reflux    Bipolar 1 disorder (HCC)    Depression    OCD (obsessive compulsive disorder)    Schizo affective schizophrenia (HCC)    Schizoaffective disorder (HCC)     There are no problems to display for this patient.   Past Surgical History:  Procedure Laterality Date   MOUTH SURGERY         Home Medications    Prior to Admission medications   Medication Sig Start Date End Date Taking? Authorizing Provider  ketoconazole (NIZORAL) 2 % cream Apply 1 application topically daily. 01/04/21  Yes Tomi Bamberger, PA-C  ARIPiprazole (ABILIFY) 5 MG tablet Take 1 tablet (5 mg total) by mouth daily. 12/20/20   Thresa Ross, MD  busPIRone (BUSPAR) 7.5 MG tablet Take 1 tablet (7.5 mg total) by mouth 3 (three) times daily. 12/20/20   Thresa Ross, MD    Family History History reviewed. No pertinent family history.  Social History Social History   Tobacco Use   Smoking status: Every Day    Packs/day: 1.00    Types: Cigarettes   Smokeless tobacco: Never  Vaping Use   Vaping Use: Never used  Substance Use Topics   Alcohol use: Yes    Comment: 2 beers per day    Drug use: Yes    Types: Marijuana     Allergies   Haldol [haloperidol] and Haloperidol and related   Review of Systems Review of  Systems  Constitutional:  Negative for chills and fever.  Eyes:  Negative for discharge and redness.  Respiratory:  Negative for shortness of breath.   Genitourinary:  Negative for dysuria and penile discharge.  Skin:  Positive for rash.    Physical Exam Triage Vital Signs ED Triage Vitals  Enc Vitals Group     BP 01/04/21 1117 112/73     Pulse Rate 01/04/21 1117 74     Resp 01/04/21 1117 16     Temp 01/04/21 1117 (!) 97.5 F (36.4 C)     Temp Source 01/04/21 1117 Oral     SpO2 01/04/21 1117 93 %     Weight --      Height --      Head Circumference --      Peak Flow --      Pain Score 01/04/21 1122 2     Pain Loc --      Pain Edu? --      Excl. in GC? --    No data found.  Updated Vital Signs BP 112/73 (BP Location: Right Arm)   Pulse 74   Temp (!) 97.5 F (36.4 C) (Oral)   Resp 16   SpO2 93%  Physical Exam Vitals and nursing note reviewed.  Constitutional:      General: He is not in acute distress.    Appearance: Normal appearance. He is not ill-appearing.  HENT:     Head: Normocephalic and atraumatic.  Eyes:     Conjunctiva/sclera: Conjunctivae normal.  Cardiovascular:     Rate and Rhythm: Normal rate.  Pulmonary:     Effort: Pulmonary effort is normal.  Skin:    Comments: Well-demarcated erythematous confluent macular rash to groin area  Neurological:     Mental Status: He is alert.  Psychiatric:        Mood and Affect: Mood normal.        Behavior: Behavior normal.        Thought Content: Thought content normal.     UC Treatments / Results  Labs (all labs ordered are listed, but only abnormal results are displayed) Labs Reviewed - No data to display  EKG   Radiology No results found.  Procedures Procedures (including critical care time)  Medications Ordered in UC Medications - No data to display  Initial Impression / Assessment and Plan / UC Course  I have reviewed the triage vital signs and the nursing notes.  Pertinent labs &  imaging results that were available during my care of the patient were reviewed by me and considered in my medical decision making (see chart for details).  Suspect likely tinea infection, ketoconazole prescribed for same.  Recommended follow-up if no gradual improvement with treatment or symptoms worsen in any way.  Final Clinical Impressions(s) / UC Diagnoses   Final diagnoses:  Tinea cruris     Discharge Instructions      Apply cream as prescribed. Follow up if no gradual improvement or if symptoms worsen in any way.      ED Prescriptions     Medication Sig Dispense Auth. Provider   ketoconazole (NIZORAL) 2 % cream Apply 1 application topically daily. 15 g Tomi Bamberger, PA-C      PDMP not reviewed this encounter.   Tomi Bamberger, PA-C 01/04/21 1137

## 2021-01-24 ENCOUNTER — Ambulatory Visit (HOSPITAL_COMMUNITY): Payer: Medicare HMO | Admitting: Psychiatry

## 2021-01-25 DIAGNOSIS — S98922D Partial traumatic amputation of left foot, level unspecified, subsequent encounter: Secondary | ICD-10-CM | POA: Diagnosis not present

## 2021-01-25 DIAGNOSIS — S98911A Complete traumatic amputation of right foot, level unspecified, initial encounter: Secondary | ICD-10-CM | POA: Diagnosis not present

## 2021-02-28 ENCOUNTER — Telehealth: Payer: Self-pay

## 2021-02-28 NOTE — Telephone Encounter (Signed)
Per [facility], Patient's power wheelchair was approved and is on order.

## 2021-03-29 ENCOUNTER — Telehealth: Payer: Self-pay

## 2021-03-29 NOTE — Telephone Encounter (Signed)
Message received from [facility]:  We are ready to deliver patient's power wheelchair, but his Voicemails are both full. Do you have an additional contact or can you try to call him to see if he will return your call if he recognizes your number?   The number for him to return our call is [phone]. That is directly to our team.    This CM called patient and informed him of above message. He was very excited. The phone number for [employer] was text to him as he requested.

## 2021-03-30 DIAGNOSIS — S98922A Partial traumatic amputation of left foot, level unspecified, initial encounter: Secondary | ICD-10-CM | POA: Diagnosis not present

## 2021-03-30 DIAGNOSIS — S98921A Partial traumatic amputation of right foot, level unspecified, initial encounter: Secondary | ICD-10-CM | POA: Diagnosis not present

## 2021-04-25 ENCOUNTER — Encounter (HOSPITAL_COMMUNITY): Payer: Self-pay | Admitting: Psychiatry

## 2021-04-25 ENCOUNTER — Ambulatory Visit (INDEPENDENT_AMBULATORY_CARE_PROVIDER_SITE_OTHER): Payer: Medicare HMO | Admitting: Psychiatry

## 2021-04-25 VITALS — BP 108/68 | HR 100 | Temp 98.2°F | Ht 70.5 in | Wt 150.8 lb

## 2021-04-25 DIAGNOSIS — F1220 Cannabis dependence, uncomplicated: Secondary | ICD-10-CM

## 2021-04-25 DIAGNOSIS — F1090 Alcohol use, unspecified, uncomplicated: Secondary | ICD-10-CM

## 2021-04-25 DIAGNOSIS — Z789 Other specified health status: Secondary | ICD-10-CM | POA: Diagnosis not present

## 2021-04-25 DIAGNOSIS — R69 Illness, unspecified: Secondary | ICD-10-CM | POA: Diagnosis not present

## 2021-04-25 DIAGNOSIS — F251 Schizoaffective disorder, depressive type: Secondary | ICD-10-CM | POA: Diagnosis not present

## 2021-04-25 MED ORDER — ARIPIPRAZOLE 5 MG PO TABS: 5.0000 mg | ORAL_TABLET | Freq: Every day | ORAL | 1 refills | Status: AC

## 2021-04-25 MED ORDER — BUSPIRONE HCL 7.5 MG PO TABS: 7.5000 mg | ORAL_TABLET | Freq: Every day | ORAL | 1 refills | Status: AC

## 2021-04-25 NOTE — Progress Notes (Signed)
[facility] Outpatient Visit  Patient Identification: Richard [name] MRN:  [id number] Date of Evaluation:  04/25/2021 Referral Source: self, pcp Chief Complaint:  establish care, schizoaffective disorder, anxiety Visit Diagnosis:    ICD-10-CM   1. Schizoaffective disorder, depressive type ([place])  F25.1     2. Alcohol use  Z78.9     3. Tetrahydrocannabinol (THC) use disorder, moderate, dependence (HCC)  F12.20       History of Present Illness: Patient is a 46 years old currently single Caucasian male currently living with his dad diagnosed with schizoaffective disorder and anxiety disorder he has moved from [place] he had a frostbite over there and relocated here he has moved from state to state states that he has been on different medications in the past he is on disability for schizoaffective disorder for the last 10 years  Last medication was Seroquel says it makes him dizzy.  Has been on different meds and providers in the past  Last visit started abilify helped but ran out and has not taken last few weeks It hellped paranoia and hallucinations. He felt buspar helped anxiety  Still uses THC , poor insight regarding its use  He was asking if he can become his own payee. I discussed in detail and not should be his own payee, considering prior non compliance, THC use and frost bite with poor insight and chronic mental healh   He says he is allergic to Haldol he has been on different medication noncompliant  Feels anxious around people, buspar has hellped  Does not endorse any clear manic symptoms does endorse history of hearing voices starting at young age and on disability for schizoaffective disorder  Aggravating factors past trauma, frostbite cannot walk straight because of his amputation Modifying factors dad, family  Duration since young age Past Psychiatric History: schizoaffective disorder, anxiety, alcohol use  Previous Psychotropic Medications: Yes   Substance Abuse History in  the last 12 months:  Yes.    Consequences of Substance Abuse: Discussed effects of THC , alacohol to depression, paranoia and jduejmet  Past Medical History:  Past Medical History:  Diagnosis Date   Acid reflux    Bipolar 1 disorder ([place])    Depression    OCD (obsessive compulsive disorder)    Schizo affective schizophrenia ([place])    Schizoaffective disorder ([place])     Past Surgical History:  Procedure Laterality Date   MOUTH SURGERY      Family Psychiatric History: not sure  Family History: History reviewed. No pertinent family history.  Social History:   Social History   Socioeconomic History   Marital status: Single    Spouse name: Not on file   Number of children: Not on file   Years of education: Not on file   Highest education level: Not on file  Occupational History   Not on file  Tobacco Use   Smoking status: Every Day    Packs/day: 1.00    Types: Cigarettes   Smokeless tobacco: Never  Vaping Use   Vaping Use: Never used  Substance and Sexual Activity   Alcohol use: Yes    Comment: 2 beers per day    Drug use: Yes    Frequency: 4.0 times per week    Types: Marijuana    Comment: Reported use every other day.   Sexual activity: Not Currently  Other Topics Concern   Not on file  Social History Narrative   ** Merged History Encounter **       Social  Determinants of Health   Financial Resource Strain: Not on file  Food Insecurity: Not on file  Transportation Needs: Not on file  Physical Activity: Not on file  Stress: Not on file  Social Connections: Not on file     Allergies:   Allergies  Allergen Reactions   Haldol [Haloperidol] Other (See Comments)    Paralysis    Haloperidol And Related     Could not move( paralyzation)    Metabolic Disorder Labs: No results found for: HGBA1C, MPG No results found for: PROLACTIN No results found for: CHOL, TRIG, HDL, CHOLHDL, VLDL, LDLCALC No results found for: TSH  Therapeutic Level Labs: No  results found for: LITHIUM No results found for: CBMZ No results found for: VALPROATE  Current Medications: Current Outpatient Medications  Medication Sig Dispense Refill   ARIPiprazole (ABILIFY) 5 MG tablet Take 1 tablet (5 mg total) by mouth daily. 30 tablet 1   busPIRone (BUSPAR) 7.5 MG tablet Take 1 tablet (7.5 mg total) by mouth daily. 30 tablet 1   ketoconazole (NIZORAL) 2 % cream Apply 1 application topically daily. (Patient not taking: Reported on 04/25/2021) 15 g 0   No current facility-administered medications for this visit.     Psychiatric Specialty Exam: Review of Systems  Cardiovascular:  Negative for leg swelling.  Psychiatric/Behavioral:  Positive for hallucinations. Negative for agitation and self-injury.    Blood pressure 108/68, pulse 100, temperature 98.2 F (36.8 C), height 5' 10.5" (1.791 m), weight 150 lb 12.8 oz (68.4 kg), SpO2 96 %.Body mass index is 21.33 kg/m.  General Appearance: Casual  Eye Contact:  Fair  Speech:  Normal Rate  Volume:  Decreased  Mood:  Euthymic  Affect:  Constricted  Thought Process:  Goal Directed  Orientation:  Full (Time, Place, and Person)  Thought Content:  Rumination  Suicidal Thoughts:  No  Homicidal Thoughts:  No  Memory:  Immediate;   Fair  Judgement:  Poor  Insight:  Shallow  Psychomotor Activity:  Decreased  Concentration:  Concentration: Fair  Recall:  [employer] of Knowledge:Fair  Language: Fair  Akathisia:   no involuntary movements  Handed:    AIMS (if indicated):  no involuntary movements  Assets:  Desire for Improvement  ADL's:  Intact  Cognition: WNL  Sleep:  Fair   Screenings: GAD-7    [place] from 10/18/2020 in Primary Care at [place] from 09/14/2020 in Primary Care at [facility]  Total GAD-7 Score 19 21      PHQ2-9    [place] Visit from 12/20/2020 in [place] Office Visit from  10/24/2020 in Primary Care at [place] from 10/18/2020 in Primary Care at [place] from 09/14/2020 in Primary Care at [facility] Total Score 1 0 2 6  PHQ-9 Total Score -- 0 14 18      [place] Visit from 04/25/2021 in [place] [facility] from 01/04/2021 in [facility] [place] Visit from 12/20/2020 in [place] No Risk No Risk No Risk       Assessment and Plan: as follows  Prior documentation reviewed  Schizoaffective disorder depressed type;  Recurrence of non commanding hallucinations and paranoia, restat abilify discussed compliance   Generalized anxiety with panic attacks; some better with buspar, will continue  THC use; discussed risk  and use of drugs to mental health, he remains reluctant to avoid THC  Discussed not to be his own payee   Fu 6 weeks - 8 weeks Has supportive dad  Time spent in office direct care including face to face 20 minutes   Merian Capron, MD 1/10/20233:15 PM

## 2021-06-15 ENCOUNTER — Ambulatory Visit (HOSPITAL_COMMUNITY): Payer: Medicare HMO | Admitting: Psychiatry

## 2021-10-05 ENCOUNTER — Ambulatory Visit: Payer: Medicare HMO | Admitting: Orthopedic Surgery

## 2022-04-20 DIAGNOSIS — Z89432 Acquired absence of left foot: Secondary | ICD-10-CM | POA: Diagnosis not present

## 2022-04-20 DIAGNOSIS — Z89431 Acquired absence of right foot: Secondary | ICD-10-CM | POA: Diagnosis not present

## 2022-04-20 DIAGNOSIS — F17210 Nicotine dependence, cigarettes, uncomplicated: Secondary | ICD-10-CM | POA: Diagnosis not present

## 2022-04-20 DIAGNOSIS — Z789 Other specified health status: Secondary | ICD-10-CM | POA: Diagnosis not present

## 2022-05-24 DIAGNOSIS — E46 Unspecified protein-calorie malnutrition: Secondary | ICD-10-CM | POA: Diagnosis not present

## 2022-05-24 DIAGNOSIS — Z59819 Housing instability, housed unspecified: Secondary | ICD-10-CM | POA: Diagnosis not present

## 2022-05-24 DIAGNOSIS — F17210 Nicotine dependence, cigarettes, uncomplicated: Secondary | ICD-10-CM | POA: Diagnosis not present

## 2022-11-22 ENCOUNTER — Ambulatory Visit (INDEPENDENT_AMBULATORY_CARE_PROVIDER_SITE_OTHER): Payer: 59 | Admitting: Orthopedic Surgery

## 2022-11-22 ENCOUNTER — Encounter: Payer: Self-pay | Admitting: Orthopedic Surgery

## 2022-11-22 DIAGNOSIS — Z89439 Acquired absence of unspecified foot: Secondary | ICD-10-CM | POA: Diagnosis not present

## 2022-11-22 NOTE — Progress Notes (Signed)
Office Visit Note   Patient: Richard [name]           Date of Birth: [birth year]           MRN: [id number] Visit Date: 11/22/2022              Requested by: Arvilla Market, MD No address on file PCP: Georganna Skeans, MD  Chief Complaint  Patient presents with   Right Foot - Follow-up   Left Foot - Follow-up      HPI: Patient is a 47 year old gentleman who is status post bilateral transmetatarsal amputations.  Patient has double upright braces extra-depth shoes custom orthotics.  The equipment is broken and patient is unable to safely ambulate with his current prosthesis.  Assessment & Plan: Visit Diagnoses:  1. History of transmetatarsal amputation of foot (HCC)     Plan: Prescription provided for [employer] for new double upright braces new extra-depth shoes with custom orthotics spacers and a carbon plate bilaterally.  Follow-Up Instructions: No follow-ups on file.   Ortho Exam  Patient is alert, oriented, no adenopathy, well-dressed, normal affect, normal respiratory effort. Examination patient's feet are plantigrade there are no ulcers no calluses no cellulitis or swelling.  Imaging: No results found. No images are attached to the encounter.  Labs: No results found for: "HGBA1C", "ESRSEDRATE", "CRP", "LABURIC", "REPTSTATUS", "GRAMSTAIN", "CULT", "LABORGA"   Lab Results  Component Value Date   ALBUMIN 4.7 12/18/2015    No results found for: "MG" No results found for: "VD25OH"  No results found for: "PREALBUMIN"    Latest Ref Rng & Units 12/18/2015    9:09 PM  CBC EXTENDED  WBC 4.0 - 10.5 K/uL 7.2   RBC 4.22 - 5.81 MIL/uL 4.94   Hemoglobin 13.0 - 17.0 g/dL 74.2   HCT 59.5 - 63.8 % 44.5   Platelets 150 - 400 K/uL 357   NEUT# 1.7 - 7.7 K/uL 3.7   Lymph# 0.7 - 4.0 K/uL 2.2      There is no height or weight on file to calculate BMI.  Orders:  No orders of the defined types were placed in this encounter.  No orders of the defined types were  placed in this encounter.    Procedures: No procedures performed  Clinical Data: No additional findings.  ROS:  All other systems negative, except as noted in the HPI. Review of Systems  Objective: Vital Signs: There were no vitals taken for this visit.  Specialty Comments:  No specialty comments available.  PMFS History: There are no problems to display for this patient.  Past Medical History:  Diagnosis Date   Acid reflux    Bipolar 1 disorder (HCC)    Depression    OCD (obsessive compulsive disorder)    Schizo affective schizophrenia (HCC)    Schizoaffective disorder (HCC)     History reviewed. No pertinent family history.  Past Surgical History:  Procedure Laterality Date   MOUTH SURGERY     Social History   Occupational History   Not on file  Tobacco Use   Smoking status: Every Day    Current packs/day: 1.00    Types: Cigarettes   Smokeless tobacco: Never  Vaping Use   Vaping status: Never Used  Substance and Sexual Activity   Alcohol use: Yes    Comment: 2 beers per day    Drug use: Yes    Frequency: 4.0 times per week    Types: Marijuana    Comment: Reported use every other  day.   Sexual activity: Not Currently

## 2023-02-15 DIAGNOSIS — Z89421 Acquired absence of other right toe(s): Secondary | ICD-10-CM | POA: Diagnosis not present

## 2023-02-15 DIAGNOSIS — M21371 Foot drop, right foot: Secondary | ICD-10-CM | POA: Diagnosis not present

## 2023-02-15 DIAGNOSIS — Z89422 Acquired absence of other left toe(s): Secondary | ICD-10-CM | POA: Diagnosis not present

## 2023-02-15 DIAGNOSIS — M21372 Foot drop, left foot: Secondary | ICD-10-CM | POA: Diagnosis not present

## 2023-03-01 ENCOUNTER — Ambulatory Visit (INDEPENDENT_AMBULATORY_CARE_PROVIDER_SITE_OTHER): Payer: 59 | Admitting: Behavioral Health

## 2023-03-01 ENCOUNTER — Encounter: Payer: Self-pay | Admitting: Behavioral Health

## 2023-03-01 DIAGNOSIS — F4310 Post-traumatic stress disorder, unspecified: Secondary | ICD-10-CM

## 2023-03-01 DIAGNOSIS — F251 Schizoaffective disorder, depressive type: Secondary | ICD-10-CM

## 2023-03-01 DIAGNOSIS — F4010 Social phobia, unspecified: Secondary | ICD-10-CM

## 2023-03-01 DIAGNOSIS — F411 Generalized anxiety disorder: Secondary | ICD-10-CM
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
## 2023-03-01 NOTE — Progress Notes (Signed)
                Richard [name], LCMHC 

## 2023-03-21 ENCOUNTER — Encounter: Payer: 59 | Admitting: Family Medicine

## 2023-04-03 ENCOUNTER — Telehealth: Payer: Self-pay | Admitting: Behavioral Health

## 2023-04-03 ENCOUNTER — Ambulatory Visit: Payer: 59 | Admitting: Behavioral Health

## 2023-04-03 NOTE — Telephone Encounter (Signed)
The patient was scheduled for a 3:00 PM virtual visit today.  I attempted to call him at 3:05 PM at the cell number listed but it was not set up to receive voicemail.  The patient did not show up for the appointment.

## 2023-05-30 ENCOUNTER — Ambulatory Visit: Payer: 59 | Admitting: Behavioral Health

## 2023-05-30 ENCOUNTER — Encounter: Payer: Self-pay | Admitting: Behavioral Health

## 2023-05-30 DIAGNOSIS — F251 Schizoaffective disorder, depressive type: Secondary | ICD-10-CM

## 2023-05-30 DIAGNOSIS — F4010 Social phobia, unspecified: Secondary | ICD-10-CM

## 2023-05-30 DIAGNOSIS — F330 Major depressive disorder, recurrent, mild: Secondary | ICD-10-CM

## 2023-05-30 DIAGNOSIS — F411 Generalized anxiety disorder: Secondary | ICD-10-CM | POA: Diagnosis not present

## 2023-05-30 DIAGNOSIS — F4310 Post-traumatic stress disorder, unspecified: Secondary | ICD-10-CM

## 2023-05-30 NOTE — Progress Notes (Signed)
Richard [name], [facility]
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
## 2023-06-21 ENCOUNTER — Ambulatory Visit: Payer: 59 | Admitting: Behavioral Health

## 2023-07-11 ENCOUNTER — Ambulatory Visit: Payer: 59 | Admitting: Behavioral Health

## 2023-07-11 ENCOUNTER — Encounter: Payer: Self-pay | Admitting: Behavioral Health

## 2023-07-11 DIAGNOSIS — F411 Generalized anxiety disorder: Secondary | ICD-10-CM

## 2023-07-11 DIAGNOSIS — F4010 Social phobia, unspecified: Secondary | ICD-10-CM | POA: Diagnosis not present

## 2023-07-11 DIAGNOSIS — F330 Major depressive disorder, recurrent, mild: Secondary | ICD-10-CM

## 2023-07-11 DIAGNOSIS — F251 Schizoaffective disorder, depressive type: Secondary | ICD-10-CM

## 2023-07-11 NOTE — Progress Notes (Signed)
 [facility] Counselor/Therapist Progress Note  Patient ID: Richard [name], MRN: [id number],    Date: 07/11/2023  Time Spent: 11:10 AM until 11:50 AM, 40 minutes, This session was held via video teletherapy. The patient consented to the video teletherapy and was located in his mother's home during this session.  He is aware it is the responsibility of the patient to secure confidentiality on his end of the session. The provider was in a private home office for the duration of this session.    The patient arrived on time for her Caregility session   Treatment Type: Individual Therapy  Reported Symptoms: Anxiety, depression, hallucinations  Mental Status Exam: Appearance:  Casual     Behavior: Appropriate  Motor: Normal  Speech/Language:  Normal Rate  Affect: Appropriate  Mood: normal  Thought process: normal  Thought content:   WNL  Sensory/Perceptual disturbances:   WNL  Orientation: oriented to person, place, time/date, situation, day of week, and month of year  Attention: Good  Concentration: Good  Memory: WNL  Fund of knowledge:  Good  Insight:   Good  Judgment:  Good  Impulse Control: Good   Risk Assessment: Danger to Self:  No Self-injurious Behavior: No Danger to Others: No Duty to Warn:no Physical Aggression / Violence:No  Access to Firearms a concern: No  Gang Involvement:No   Subjective: Patient reports that his anxiety has been on the "out the roof."  He says that he cannot sit still for more than just a few minutes and fidgets constantly.  He acknowledges that a lot of and that was about a part of housing. He is actively saving money to try to find a mobile home that he can afford said right now that time his price range in rent for a while and it would still be more than he could afford monthly based on his disability income.  Because both of his feet are partially amputated he is limited in what he can do physically saying he cannot stand up for  more than about 10 minutes or the neuropathy make the pain unbearable.  He would like to get settled somewhere to be able to find a job that he can do part-time either from home or where he could sit.  He is taking things and progression finding housing first and then possibly transportation at a job.  Thankfully he is comfortable at home what he is staying at his mother or his father's house.  He is using some coping skills including breathing but also reports today being marijuana a couple times a week saying that is really the only thing that keeps his anxiety level manageable.  He is open to referral for medication evaluation so I gave him the information for [facility] for medication evaluation.  He has been on multiple medications over the past and is somewhat wary of them but also knows he needs to find the best way to manage his anxiety.  I did introduce a couple of other coping skills including on vagus nerve stimulation technique.  Does report random what he describes as whispers a few times a month but says they are not frightening and do not happen consistently.  He reports no visual hallucinations or delusions.   He does contract for safety saying he has no thoughts of hurting himself or anyone else.  He does acknowledge smoking marijuana regularly as a way to reduce his anxiety.  Interventions: Cognitive Behavioral Therapy and Dialectical Behavioral Therapy  Target date:  09/14/23  Diagnosis: Generalized anxiety disorder, social anxiety disorder, major depressive disorder, recurrent, mild  Plan: I will meet with the patient every 3 weeks via care agility  French Ana, [facility]                  French Ana, [facility]

## 2023-08-09 ENCOUNTER — Ambulatory Visit: Admitting: Behavioral Health

## 2023-08-29 ENCOUNTER — Ambulatory Visit (INDEPENDENT_AMBULATORY_CARE_PROVIDER_SITE_OTHER): Admitting: Behavioral Health

## 2023-08-29 ENCOUNTER — Encounter: Payer: Self-pay | Admitting: Behavioral Health

## 2023-08-29 DIAGNOSIS — F330 Major depressive disorder, recurrent, mild: Secondary | ICD-10-CM

## 2023-08-29 DIAGNOSIS — F4010 Social phobia, unspecified: Secondary | ICD-10-CM

## 2023-08-29 DIAGNOSIS — F251 Schizoaffective disorder, depressive type: Secondary | ICD-10-CM

## 2023-08-29 DIAGNOSIS — F4310 Post-traumatic stress disorder, unspecified: Secondary | ICD-10-CM

## 2023-08-29 DIAGNOSIS — F411 Generalized anxiety disorder: Secondary | ICD-10-CM | POA: Diagnosis not present

## 2023-08-29 NOTE — Progress Notes (Signed)
 [facility] Counselor/Therapist Progress Note  Patient ID: Richard [name], MRN: [id number],    Date: 08/29/2023  Time Spent: 11:00 AM until 11:39 AM, 39 minutes, This session was held via video teletherapy. The patient consented to the video teletherapy and was located in his mother's home during this session.  He is aware it is the responsibility of the patient to secure confidentiality on his end of the session. The provider was in a private home office for the duration of this session.    The patient arrived on time for her Caregility session   Treatment Type: Individual Therapy  Reported Symptoms: Anxiety, depression, hallucinations  Mental Status Exam: Appearance:  Casual     Behavior: Appropriate  Motor: Normal  Speech/Language:  Normal Rate  Affect: Appropriate  Mood: normal  Thought process: normal  Thought content:   WNL  Sensory/Perceptual disturbances:   WNL  Orientation: oriented to person, place, time/date, situation, day of week, and month of year  Attention: Good  Concentration: Good  Memory: WNL  Fund of knowledge:  Good  Insight:   Good  Judgment:  Good  Impulse Control: Good   Risk Assessment: Danger to Self:  No Self-injurious Behavior: No Danger to Others: No Duty to Warn:no Physical Aggression / Violence:No  Access to Firearms a concern: No  Gang Involvement:No   Subjective: The patient reports that he continues to look for someone to live independently but everything right now is too expensive.  He is putting aside money every month but is spending most of the time at his mother's home.  He reports that his anxiety has been better but we did a GAD score on which he scored 14.  He had not contacted [facility] as I ask him to do in the last session so he did commit to doing that after this session.  He has not been treated for ADD since he was a child and he has not had anything for anxiety and close to a year.  I did  introduce a new anxiety reduction techniques, progressive muscle relaxation and encouraged him to practice that in addition to the ones that we had talked about. Does report random what he describes as whispers a few times a month but says they are not frightening and do not happen consistently.  He reports no visual hallucinations or delusions.   He does contract for safety saying he has no thoughts of hurting himself or anyone else.  He does acknowledge smoking marijuana regularly as a way to reduce his anxiety.  Interventions: Cognitive Behavioral Therapy and Dialectical Behavioral Therapy  Target date: 09/14/23  Diagnosis: Generalized anxiety disorder, social anxiety disorder, major depressive disorder, recurrent, mild  Plan: I will meet with the patient every 3 weeks via care agility  Cecile Coder, [facility]                  Cecile Coder, [facility]               Cecile Coder, [facility]

## 2023-09-18 ENCOUNTER — Encounter: Payer: Self-pay | Admitting: Behavioral Health

## 2023-09-18 ENCOUNTER — Ambulatory Visit: Admitting: Behavioral Health

## 2023-09-18 DIAGNOSIS — F4310 Post-traumatic stress disorder, unspecified: Secondary | ICD-10-CM

## 2023-09-18 DIAGNOSIS — F330 Major depressive disorder, recurrent, mild: Secondary | ICD-10-CM

## 2023-09-18 DIAGNOSIS — F411 Generalized anxiety disorder: Secondary | ICD-10-CM | POA: Diagnosis not present

## 2023-09-18 DIAGNOSIS — F4010 Social phobia, unspecified: Secondary | ICD-10-CM

## 2023-09-18 NOTE — Progress Notes (Addendum)
 [facility] Counselor/Therapist Progress Note  Patient ID: Richard [name], MRN: [id number],    Date: 09/18/2023  Time Spent: 10:00 AM until 10:39 AM, 39 minutes, This session was held via video teletherapy. The patient consented to the video teletherapy and was located in his mother's home during this session.  He is aware it is the responsibility of the patient to secure confidentiality on his end of the session. The provider was in a private home office for the duration of this session.    The patient arrived on time for his Caregility session   Treatment Type: Individual Therapy  Reported Symptoms: Anxiety, depression, hallucinations  Mental Status Exam: Appearance:  Casual     Behavior: Appropriate  Motor: Normal  Speech/Language:  Normal Rate  Affect: Appropriate  Mood: normal  Thought process: normal  Thought content:   WNL  Sensory/Perceptual disturbances:   WNL  Orientation: oriented to person, place, time/date, situation, day of week, and month of year  Attention: Good  Concentration: Good  Memory: WNL  Fund of knowledge:  Good  Insight:   Good  Judgment:  Good  Impulse Control: Good   Risk Assessment: Danger to Self:  No Self-injurious Behavior: No Danger to Others: No Duty to Warn:no Physical Aggression / Violence:No  Access to Firearms a concern: No  Gang Involvement:No   Subjective: The patient reports that he is hopeful that his son will come visit for a few weeks over the summer.  Typically that his the time that he gets with his son.  He does speak with his son via face time almost daily.  He says at times is difficult because his son is on the mild end of the autism spectrum and has severe ADHD.  For the most part his anxiety is manageable.  He has learned how to work with that especially going in social places.  Most of his anxiety now centers around trying to find somewhere to live.  He is trying to find the balance of something he can afford  in a safe place that is within walking distance of his home.  He says it is a daily looking on line and trying to find the right spot.  He has not looked into medication evaluation so I gave him the name of his primary care physician that he missed an appointment with the December encouraging him to call back and see if he could get in.  He does not do well with most medications but said Abilify  has been beneficial in the past and I recommended that he ask about hydroxyzine or BuSpar  for his anxiety.  He says he knows that he needs something that but has an extreme fear of medication as well as had some very strong medical/physical reactions to several medications.  I encouraged him to make a list of all those things that he had tried that he had strong reactions to as well as those that he had gotten some benefit from without side effects to present to her once he gets an appointment. He does contract for safety saying he has no thoughts of hurting himself or anyone else.  He does acknowledge smoking marijuana regularly as a way to reduce his anxiety.  Interventions: Cognitive Behavioral Therapy and Dialectical Behavioral Therapy  Target date: 03/15/24  Diagnosis: Generalized anxiety disorder, social anxiety disorder, major depressive disorder, recurrent, mild  Plan: I will meet with the patient every 3 weeks via care agility  Cecile Coder, [facility]  Cecile Coder, [facility]               Cecile Coder, [facility]               Cecile Coder, [facility]

## 2023-10-09 ENCOUNTER — Encounter: Payer: Self-pay | Admitting: Behavioral Health

## 2023-10-09 ENCOUNTER — Ambulatory Visit (INDEPENDENT_AMBULATORY_CARE_PROVIDER_SITE_OTHER): Admitting: Behavioral Health

## 2023-10-09 DIAGNOSIS — F330 Major depressive disorder, recurrent, mild: Secondary | ICD-10-CM

## 2023-10-09 DIAGNOSIS — F411 Generalized anxiety disorder: Secondary | ICD-10-CM

## 2023-10-09 DIAGNOSIS — F4310 Post-traumatic stress disorder, unspecified: Secondary | ICD-10-CM

## 2023-10-09 DIAGNOSIS — F4010 Social phobia, unspecified: Secondary | ICD-10-CM

## 2023-10-09 NOTE — Progress Notes (Signed)
 [facility] Counselor/Therapist Progress Note  Patient ID: Javon Snee, MRN: [id number],    Date: 10/09/2023  Time Spent: 10:00 AM until 10:43 AM, 43 minutes, This session was held via video teletherapy. The patient consented to the video teletherapy and was located in his mother's home during this session.  He is aware it is the responsibility of the patient to secure confidentiality on his end of the session. The provider was in a private home office for the duration of this session.    The patient arrived on time for his Caregility session   Treatment Type: Individual Therapy  Reported Symptoms: Anxiety, depression, hallucinations  Mental Status Exam: Appearance:  Casual     Behavior: Appropriate  Motor: Normal  Speech/Language:  Normal Rate  Affect: Appropriate  Mood: normal  Thought process: normal  Thought content:   WNL  Sensory/Perceptual disturbances:   WNL  Orientation: oriented to person, place, time/date, situation, day of week, and month of year  Attention: Good  Concentration: Good  Memory: WNL  Fund of knowledge:  Good  Insight:   Good  Judgment:  Good  Impulse Control: Good   Risk Assessment: Danger to Self:  No Self-injurious Behavior: No Danger to Others: No Duty to Warn:no Physical Aggression / Violence:No  Access to Firearms a concern: No  Gang Involvement:No  The patient did reach out to [facility] for medication evaluation and they do not take Medicaid or Medicare and are not taking new patients currently.  Not reached out to his primary care physician so I again encouraged him to reach out to her to at least explore the possibility of trying Abilify  as he knows that has had some benefit for him in the past.  We talked more about his anxiety with medication based on his history and things that might help him get to the point he is willing to try that.  He says his anxiety has escalated because he continues to look for housing  and is not finding anything that is a good fit.  In addition his son who is 11 and on the autism spectrum is coming to stay for 2 weeks.  He says that he intellectually functions as about 47 years old and often has what he describes as childlike tantrums when he does not get his way.  He says that triggers his own anxiety and irritability so we read visited the mindfulness and calming/coping exercises we have been working on encouraging him to use those while the son is there.  He is going to try to keep his son is busy and is active as he can be.  Also encouraged him to reach out to Medicare and see if he has access to a caseworker to help him with housing also gave him the number to [place]  [employer]/both patient will rehabilitation to call for both housing.   He does contract for safety saying he has no thoughts of hurting himself or anyone else.  He does acknowledge smoking marijuana regularly as a way to reduce his anxiety.  Interventions: Cognitive Behavioral Therapy and Dialectical Behavioral Therapy  Target date: 03/15/24  Diagnosis: Generalized anxiety disorder, social anxiety disorder, major depressive disorder, recurrent, mild  Plan: I will meet with the patient every 3 weeks via care agility  Lorrene CHRISTELLA Hasten, [facility]                  Lorrene CHRISTELLA Hasten, [facility]  Lorrene CHRISTELLA Hasten, [facility]               Lorrene CHRISTELLA Hasten, [facility]               Lorrene CHRISTELLA Hasten, [facility]

## 2023-10-30 ENCOUNTER — Encounter: Payer: Self-pay | Admitting: Behavioral Health

## 2023-10-30 ENCOUNTER — Ambulatory Visit (INDEPENDENT_AMBULATORY_CARE_PROVIDER_SITE_OTHER): Admitting: Behavioral Health

## 2023-10-30 DIAGNOSIS — F4010 Social phobia, unspecified: Secondary | ICD-10-CM | POA: Diagnosis not present

## 2023-10-30 DIAGNOSIS — F411 Generalized anxiety disorder: Secondary | ICD-10-CM | POA: Diagnosis not present

## 2023-10-30 DIAGNOSIS — F330 Major depressive disorder, recurrent, mild: Secondary | ICD-10-CM | POA: Diagnosis not present

## 2023-10-30 DIAGNOSIS — F4310 Post-traumatic stress disorder, unspecified: Secondary | ICD-10-CM
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
## 2023-11-20 ENCOUNTER — Ambulatory Visit: Admitting: Behavioral Health

## 2023-12-13 ENCOUNTER — Ambulatory Visit: Admitting: Behavioral Health
